# Patient Record
Sex: Female | Born: 1970 | Race: White | Hispanic: No | Marital: Single | Smoking: Never smoker
Health system: Southern US, Community
[De-identification: ages and names within clinical notes are randomized; demographics above are authoritative.]

## PROBLEM LIST (undated history)

## (undated) DIAGNOSIS — I10 Essential (primary) hypertension: Secondary | ICD-10-CM

## (undated) DIAGNOSIS — M549 Dorsalgia, unspecified: Secondary | ICD-10-CM

## (undated) DIAGNOSIS — J45909 Unspecified asthma, uncomplicated: Secondary | ICD-10-CM

## (undated) DIAGNOSIS — E039 Hypothyroidism, unspecified: Secondary | ICD-10-CM

## (undated) DIAGNOSIS — G8929 Other chronic pain: Secondary | ICD-10-CM

## (undated) DIAGNOSIS — M25562 Pain in left knee: Secondary | ICD-10-CM

## (undated) HISTORY — DX: Pain in left knee: M25.562

## (undated) HISTORY — PX: KNEE SURGERY: SHX244

---

## 2002-10-19 ENCOUNTER — Encounter: Admission: RE | Admit: 2002-10-19 | Discharge: 2002-10-19 | Payer: Self-pay | Admitting: Family Medicine

## 2002-10-19 ENCOUNTER — Encounter: Payer: Self-pay | Admitting: Family Medicine

## 2004-04-19 ENCOUNTER — Ambulatory Visit: Payer: Self-pay | Admitting: Pain Medicine

## 2004-05-04 ENCOUNTER — Ambulatory Visit: Payer: Self-pay | Admitting: Physician Assistant

## 2004-06-07 ENCOUNTER — Ambulatory Visit: Payer: Self-pay | Admitting: Pain Medicine

## 2004-07-10 ENCOUNTER — Ambulatory Visit: Payer: Self-pay | Admitting: Pain Medicine

## 2004-07-31 ENCOUNTER — Ambulatory Visit: Payer: Self-pay | Admitting: Physician Assistant

## 2004-12-20 ENCOUNTER — Ambulatory Visit: Payer: Self-pay | Admitting: Physician Assistant

## 2005-01-29 ENCOUNTER — Ambulatory Visit: Payer: Self-pay | Admitting: Pain Medicine

## 2005-04-22 ENCOUNTER — Ambulatory Visit: Payer: Self-pay | Admitting: Physician Assistant

## 2005-08-19 ENCOUNTER — Ambulatory Visit: Payer: Self-pay | Admitting: Physician Assistant

## 2010-02-27 ENCOUNTER — Ambulatory Visit: Payer: Self-pay | Admitting: Family Medicine

## 2012-08-24 ENCOUNTER — Emergency Department: Payer: Self-pay | Admitting: Emergency Medicine

## 2014-10-13 ENCOUNTER — Inpatient Hospital Stay
Admit: 2014-10-13 | Disposition: A | Discharge status: Home / Self Care | Payer: Self-pay | Attending: Internal Medicine | Admitting: Internal Medicine

## 2014-10-13 LAB — CBC WITH DIFFERENTIAL/PLATELET
BASOS PCT: 0.7 %
Basophil #: 0.1 10*3/uL (ref 0.0–0.1)
EOS ABS: 0.3 10*3/uL (ref 0.0–0.7)
EOS PCT: 2 %
HCT: 38.5 % (ref 35.0–47.0)
HGB: 12.5 g/dL (ref 12.0–16.0)
LYMPHS PCT: 25.6 %
Lymphocyte #: 3.3 10*3/uL (ref 1.0–3.6)
MCH: 25.5 pg — ABNORMAL LOW (ref 26.0–34.0)
MCHC: 32.5 g/dL (ref 32.0–36.0)
MCV: 79 fL — ABNORMAL LOW (ref 80–100)
MONOS PCT: 8.5 %
Monocyte #: 1.1 x10 3/mm — ABNORMAL HIGH (ref 0.2–0.9)
NEUTROS PCT: 63.2 %
Neutrophil #: 8.1 10*3/uL — ABNORMAL HIGH (ref 1.4–6.5)
Platelet: 305 10*3/uL (ref 150–440)
RBC: 4.89 10*6/uL (ref 3.80–5.20)
RDW: 16.4 % — ABNORMAL HIGH (ref 11.5–14.5)
WBC: 12.8 10*3/uL — ABNORMAL HIGH (ref 3.6–11.0)

## 2014-10-13 LAB — BASIC METABOLIC PANEL
Anion Gap: 7 (ref 7–16)
BUN: 11 mg/dL
CREATININE: 0.82 mg/dL
Calcium, Total: 8.9 mg/dL
Chloride: 98 mmol/L — ABNORMAL LOW
Co2: 33 mmol/L — ABNORMAL HIGH
EGFR (African American): >60
GLUCOSE: 77 mg/dL
Potassium: 3.2 mmol/L — ABNORMAL LOW
Sodium: 138 mmol/L

## 2014-10-14 LAB — CBC WITH DIFFERENTIAL/PLATELET
BASOS PCT: 0.5 %
Basophil #: 0.1 10*3/uL (ref 0.0–0.1)
EOS ABS: 0.3 10*3/uL (ref 0.0–0.7)
Eosinophil %: 2.3 %
HCT: 33.7 % — ABNORMAL LOW (ref 35.0–47.0)
HGB: 10.9 g/dL — AB (ref 12.0–16.0)
Lymphocyte #: 4.3 10*3/uL — ABNORMAL HIGH (ref 1.0–3.6)
Lymphocyte %: 35.1 %
MCH: 25.4 pg — AB (ref 26.0–34.0)
MCHC: 32.2 g/dL (ref 32.0–36.0)
MCV: 79 fL — ABNORMAL LOW (ref 80–100)
MONO ABS: 1.2 x10 3/mm — AB (ref 0.2–0.9)
Monocyte %: 9.4 %
NEUTROS ABS: 6.5 10*3/uL (ref 1.4–6.5)
NEUTROS PCT: 52.7 %
Platelet: 273 10*3/uL (ref 150–440)
RBC: 4.28 10*6/uL (ref 3.80–5.20)
RDW: 16.5 % — ABNORMAL HIGH (ref 11.5–14.5)
WBC: 12.3 10*3/uL — ABNORMAL HIGH (ref 3.6–11.0)

## 2014-10-14 LAB — BASIC METABOLIC PANEL
Anion Gap: 5 — ABNORMAL LOW (ref 7–16)
BUN: 10 mg/dL
CALCIUM: 8.2 mg/dL — AB
CREATININE: 0.73 mg/dL
Chloride: 103 mmol/L
Co2: 29 mmol/L
Glucose: 102 mg/dL — ABNORMAL HIGH
POTASSIUM: 3.3 mmol/L — AB
SODIUM: 137 mmol/L

## 2014-10-15 LAB — VANCOMYCIN, TROUGH: Vancomycin, Trough: 20 ug/mL

## 2014-10-16 LAB — VANCOMYCIN, TROUGH: VANCOMYCIN, TROUGH: 22 ug/mL — AB

## 2014-10-18 LAB — CULTURE, BLOOD (SINGLE)

## 2014-11-06 NOTE — Consult Note (Signed)
PATIENT NAME:  Amy RuaREMETZ, Tala M MR#:  [id number] DATE OF BIRTH:  May 02, 1971  DATE OF CONSULTATION:  10/15/2014  REFERRING PHYSICIAN:   CONSULTING PHYSICIAN:  Adah Salvageichard E. Excell Seltzerooper, MD  CHIEF COMPLAINT: Left arm cellulitis.   HISTORY OF PRESENT ILLNESS:   This is a patient with several days of left arm pain and a diagnosis of cellulitis which is improved considerably while in the hospital the last few days on IV antibiotics. I was asked to see the patient for possible abscess.   She thinks that this may have been a spider bite.  She had had some fevers and chills, but did not take her temperature.   PAST MEDICAL HISTORY: Hypothyroidism and morbid obesity.   PAST SURGICAL HISTORY: None.   SOCIAL HISTORY: The patient does not smoke or drink, and does not use IV drugs.   REVIEW OF SYSTEMS: A complete system review was performed and negative with the exception of that mentioned in the HPI.   FAMILY HISTORY:   Diabetes.   MEDICATIONS: Multiple, see reconciliation.   ALLERGIES: None.   PHYSICAL EXAMINATION: GENERAL: Morbidly obese female patient with a BMI of 39.  VITAL SIGNS: Temperature is 97.6, pulse is 75, respirations 20, blood pressure 103/67.  GENERAL: The patient appears comfortable.  HEENT: Shows no scleral icterus.  NECK: No palpable neck nodes.  CHEST: Clear to auscultation.   CARDIAC: Regular rate and rhythm.  ABDOMEN: Soft, nontender but obese.  EXTREMITIES: Show no edema but on the left arm, there is a punctate area of eschar with no expressible pus. No surrounding erythema. There are ink margins drawn where prior erythema was present, none presently.  The patient has full range of motion at the wrist and fingers with no tenderness with flexion or extension of the extensors.   ASSESSMENT AND PLAN: This is a patient with resolving cellulitis. No sign of abscess. Recommend observation at this time and re-examination as well as elevation of her arm and continuing IV antibiotics.      ____________________________ Adah Salvageichard E. Excell Seltzerooper, MD rec:tr D: 10/15/2014 16:26:10 ET T: 10/15/2014 17:16:38 ET JOB#: [id number]  cc: Adah Salvageichard E. Excell Seltzerooper, MD, <Dictator> Lattie HawICHARD E Soleia Badolato MD ELECTRONICALLY SIGNED 10/15/2014 19:06

## 2014-11-06 NOTE — H&P (Signed)
PATIENT NAME:  Amy Brown, [name] MR#:  [id number] DATE OF BIRTH:  May 17, 1971  DATE OF ADMISSION:  10/13/2014  REFERRING PHYSICIAN:  Eartha Inchory R. York CeriseForbach, MD  PRIMARY CARE PHYSICIAN:  Amy SaverL. Katherine Bliss, MD   CHIEF COMPLAINT:  Left arm pain.   HISTORY OF PRESENT ILLNESS:  A 44 year old Caucasian female with history of hypothyroidism, unspecified, as well as lumbago, presenting with left arm pain. She describes a 1-day duration of left arm pain located in the forearm, initially thought it was a possible "spider bite," describes pain as throbbing in quality, 7 out of 10 in intensity, worsened with activity, no real relieving factors with surrounding erythema and no discharge. She does complain of subjective fevers and chills, thus presented to the hospital for further workup and evaluation.   REVIEW OF SYSTEMS: CONSTITUTIONAL:  Positive for subjective fevers and chills. Denies fatigue or weakness.  EYES:  Denies blurred vision, double vision, or eye pain.  EARS, NOSE, AND THROAT:  Denies tinnitus, ear pain, or hearing loss.  RESPIRATORY:  Denies cough, wheeze, or shortness of breath.  CARDIOVASCULAR:  Denies chest pain, palpitations, or edema.  GASTROINTESTINAL:  Denies nausea, vomiting, diarrhea, or abdominal pain.  GENITOURINARY:  Denies dysuria or hematuria.  ENDOCRINE:  Denies nocturia or thyroid problems.  HEMATOLOGIC AND LYMPHATIC:  Denies easy bruising or bleeding.  SKIN:  Positive for erythematous lesion on the left forearm as described above.  MUSCULOSKELETAL:  Positive for pain in the left forearm as described above. Otherwise, denies pain in the neck, back, shoulders, knees, or hips or further arthritic symptoms.  NEUROLOGIC:  Denies paralysis or paresthesias.  PSYCHIATRIC:  Denies anxiety or depressive symptoms.  Otherwise, full review of systems performed by me is negative.   PAST MEDICAL HISTORY:  Includes chronic lumbago and hypothyroidism, unspecified.   SOCIAL HISTORY:  Denies any  alcohol, tobacco, or drug usage.   FAMILY HISTORY:  Positive for diabetes as well as coronary artery disease.   HOME MEDICATIONS:  Include meloxicam 15 mg p.o. daily, methadone 10 mg p.o. 4 times daily, Lasix 40 mg 2 tablets daily, Flexeril 10 mg p.o. 3 times daily as needed.   PHYSICAL EXAMINATION: VITAL SIGNS:  Temperature 98.1, heart rate 92, respirations 20, blood pressure 120/84, saturating 97% on room air. Weight 114.4 kg, BMI 38.4.  GENERAL:  Obese, Caucasian female currently in no acute distress.  HEAD:  Normocephalic, atraumatic.  EYES:  Pupils are equal, round, and reactive to light. Extraocular muscles are intact. No scleral icterus.  MOUTH:  Moist mucosal membranes. Dentition is intact. No abscess noted.  EARS, NOSE, AND THROAT:  Clear without exudates. No external lesions.  NECK:  Supple. No thyromegaly. No nodules. No JVD.  PULMONARY:  Clear to auscultation bilaterally without wheezes, rales, or rhonchi. No use of accessory muscles. Good respiratory effort.  CHEST:  Nontender to palpation.  CARDIOVASCULAR:  S1 and S2, regular rate and rhythm. No murmurs, rubs, or gallops. No edema. Pedal pulses are 2+ bilaterally.  GASTROINTESTINAL:  Soft, nontender, nondistended. No masses. Positive bowel sounds. No hepatosplenomegaly.  MUSCULOSKELETAL:  There is gross left forearm swelling present; otherwise, no edema or clubbing. Range of motion is full in all extremities.  NEUROLOGIC:  Cranial nerves II through XII are intact. No gross focal neurological deficits. Sensation is intact. Reflexes are intact.  SKIN:  There is an erythematous region of the left forearm with a punctate lesion at the center, which has been marked with a marker. Warm to touch. Otherwise, no  further lesions, rashes, or cyanosis. Skin is warm and dry. Turgor is intact.  PSYCHIATRIC:  Mood and affect are within normal limits. The patient is awake, alert, and oriented x 3. Insight and judgment are intact.   LABORATORY  DATA:  Sodium is 138, potassium 3.2, chloride 98, bicarbonate 33, BUN 11, creatinine 0.82, glucose 77. WBC is 12.8, hemoglobin 12.5, and platelets 309,000.   ASSESSMENT AND PLAN:  A 44 year old Caucasian female with a history of hypothyroidism, unspecified, as well as lumbago, chronic, presenting with left arm pain.   1.  Left arm cellulitis. We will follow blood cultures that were drawn in the Emergency Department and cover with vancomycin for antibiotic coverage. Follow culture data. Adjust antibiotics accordingly. Provide p.r.n. pain medications.  2.  Lumbago. Continue with home medications including Flexeril, methadone, and meloxicam.  3.  Hypokalemia. Replace to goal of 4 to 5.  4.  Venous thromboembolism prophylaxis with heparin subcutaneously.   CODE STATUS:  The patient is a full code.   TIME SPENT:  35 minutes.    ____________________________ Cletis Athens. Hower, MD dkh:nb D: 10/13/2014 22:51:43 ET T: 10/13/2014 23:32:03 ET JOB#: [id number]  cc: Cletis Athens. Hower, MD, <Dictator> DAVID Synetta Shadow MD ELECTRONICALLY SIGNED 10/14/2014 3:56

## 2014-11-06 NOTE — Discharge Summary (Signed)
PATIENT NAME:  Amy RuaREMETZ, Amy Brown MR#:  [id number] DATE OF BIRTH:  16-Jan-1971  DATE OF ADMISSION:  10/13/2014 DATE OF DISCHARGE:  10/17/2014  DISCHARGE DIAGNOSES: 1. Left forearm cellulitis.  2. Chronic low back pain.  3. Sepsis.   DISCHARGE MEDICATIONS: 1. Flexeril 10 mg oral 3 times a day.  2. Lasix 40 mg 2 tablets daily.  3. Meloxicam 15 mg oral once a day.  4. Methadone 10 mg oral 4 times a day.  5. Ketorolac 10 mg oral every 6 hours as needed for headache.  6. Acetaminophen and oxycodone 325/5 one tablet every 6 hours as needed.  7. Bactrim DS 800/160 mg oral 2 times a day.   DISCHARGE INSTRUCTIONS:  Regular food. Activity as tolerated. Follow up with Dr. Quillian QuinceBliss in 1-2 weeks.   CONSULTS: Dr. Excell Seltzerooper with surgery.   IMAGING STUDIES: Include an ultrasound of the left forearm which showed swelling and cellulitis, but no frank abscess.   ADMITTING HISTORY AND PHYSICAL AND HOSPITAL COURSE: Please see detailed H and P dictated previously by Dr. Clint GuyHower. In brief, a 44 year old Caucasian female patient who recently had a spider bite. Presented with worsening redness, swelling and pain of the left forearm. She was started on vancomycin to which she responded well. Surgery was consulted for possible I and D. No I and D was advised.  Ultrasound did not show any frank abscess. The patient has improved well.  Redness has almost resolved.  Afebrile, normal white count, and is being discharged home in a stable condition.   Prior to discharge, the patient's lungs sound clear. S1, S2 heard.   TIME SPENT ON DAY OF DISCHARGE IN DISCHARGE ACTIVITY: Was 40 minutes.    ____________________________ Amy HighmanStriker R. Orlie DakinSudanic, MD srs:tr D: 10/18/2014 15:52:27 ET T: 10/18/2014 21:13:22 ET JOB#: [id number]  cc: Wardell HeathSrikar R. Kittie Krizan, MD, <Dictator> Orie FishermanSRIKAR R Kynzlie Hilleary MD ELECTRONICALLY SIGNED 10/31/2014 11:17

## 2014-11-06 NOTE — Consult Note (Signed)
Brief Consult Note: Diagnosis: left forearm cellulitis.   Patient was seen by consultant.   Consult note dictated.   Recommend further assessment or treatment.   Orders entered.   Comments: improving cellulitis, no obv abscess elevate left arm cont IV abx reexamine.  Electronic Signatures: Lattie Hawooper, Amadea Keagy E (MD)  (Signed 09-Apr-16 16:20)  Authored: Brief Consult Note   Last Updated: 09-Apr-16 16:20 by Lattie Hawooper, Kimala Horne E (MD)

## 2015-02-09 ENCOUNTER — Encounter: Payer: Self-pay | Admitting: Urgent Care

## 2015-02-09 ENCOUNTER — Emergency Department: Payer: Medicaid Other

## 2015-02-09 ENCOUNTER — Emergency Department
Admission: EM | Admit: 2015-02-09 | Discharge: 2015-02-09 | Disposition: A | Discharge status: Home / Self Care | Payer: Medicaid Other | Attending: Emergency Medicine | Admitting: Emergency Medicine

## 2015-02-09 DIAGNOSIS — Y998 Other external cause status: Secondary | ICD-10-CM | POA: Diagnosis not present

## 2015-02-09 DIAGNOSIS — S0990XA Unspecified injury of head, initial encounter: Secondary | ICD-10-CM | POA: Insufficient documentation

## 2015-02-09 DIAGNOSIS — W010XXA Fall on same level from slipping, tripping and stumbling without subsequent striking against object, initial encounter: Secondary | ICD-10-CM | POA: Diagnosis not present

## 2015-02-09 DIAGNOSIS — Z79899 Other long term (current) drug therapy: Secondary | ICD-10-CM | POA: Diagnosis not present

## 2015-02-09 DIAGNOSIS — Y9389 Activity, other specified: Secondary | ICD-10-CM | POA: Diagnosis not present

## 2015-02-09 DIAGNOSIS — R509 Fever, unspecified: Secondary | ICD-10-CM | POA: Insufficient documentation

## 2015-02-09 DIAGNOSIS — Z791 Long term (current) use of non-steroidal anti-inflammatories (NSAID): Secondary | ICD-10-CM | POA: Diagnosis not present

## 2015-02-09 DIAGNOSIS — Z79891 Long term (current) use of opiate analgesic: Secondary | ICD-10-CM | POA: Diagnosis not present

## 2015-02-09 DIAGNOSIS — Y9289 Other specified places as the place of occurrence of the external cause: Secondary | ICD-10-CM | POA: Diagnosis not present

## 2015-02-09 DIAGNOSIS — Z3202 Encounter for pregnancy test, result negative: Secondary | ICD-10-CM | POA: Insufficient documentation

## 2015-02-09 DIAGNOSIS — W19XXXA Unspecified fall, initial encounter: Secondary | ICD-10-CM

## 2015-02-09 DIAGNOSIS — R51 Headache: Secondary | ICD-10-CM

## 2015-02-09 DIAGNOSIS — R519 Headache, unspecified: Secondary | ICD-10-CM

## 2015-02-09 HISTORY — DX: Other chronic pain: G89.29

## 2015-02-09 HISTORY — DX: Dorsalgia, unspecified: M54.9

## 2015-02-09 LAB — URINALYSIS COMPLETE WITH MICROSCOPIC (ARMC ONLY)
BACTERIA UA: NONE SEEN
BILIRUBIN URINE: NEGATIVE
Glucose, UA: NEGATIVE mg/dL
HGB URINE DIPSTICK: NEGATIVE
Ketones, ur: NEGATIVE mg/dL
Leukocytes, UA: NEGATIVE
Nitrite: NEGATIVE
PH: 6 (ref 5.0–8.0)
Protein, ur: NEGATIVE mg/dL
Specific Gravity, Urine: 1.021 (ref 1.005–1.030)

## 2015-02-09 LAB — POCT PREGNANCY, URINE: PREG TEST UR: NEGATIVE

## 2015-02-09 MED ORDER — HYDROMORPHONE HCL 1 MG/ML IJ SOLN
1.0000 mg | Freq: Once | INTRAMUSCULAR | Status: AC
  Administered 2015-02-09: 1 mg via INTRAMUSCULAR
  Filled 2015-02-09: qty 1

## 2015-02-09 NOTE — ED Notes (Signed)
Patient presents with reports of a headache that occurred yesterday at 0100. Patient reports that she was walking her dogs and they caused her to fall and hit head on steps. Denies LOC. Ever since she fell c/o headache no change even with torodol and methadone.

## 2015-02-09 NOTE — Discharge Instructions (Signed)
You have been seen in the emergency department for a headache following a fall 24 hours ago. Your workup including CT scan shows normal results. You do have a low grade fever today. Please take Tylenol/Motrin at home as needed for headache or fever. As we discussed please call your primary care doctor (Dr. Bliss) today to arrange a follow-up appQuillian Quincentment hopefully for today. Return to the emergency department for any worsening headache, worsening fever, confusion, lethargy, or any other symptom personally concerning to your self.    General Headache Without Cause A headache is pain or discomfort felt around the head or neck area. The specific cause of a headache may not be found. There are many causes and types of headaches. A few common ones are:  Tension headaches.  Migraine headaches.  Cluster headaches.  Chronic daily headaches. HOME CARE INSTRUCTIONS   Keep all follow-up appointments with your caregiver or any specialist referral.  Only take over-the-counter or prescription medicines for pain or discomfort as directed by your caregiver.  Lie down in a dark, quiet room when you have a headache.  Keep a headache journal to find out what may trigger your migraine headaches. For example, write down:  What you eat and drink.  How much sleep you get.  Any change to your diet or medicines.  Try massage or other relaxation techniques.  Put ice packs or heat on the head and neck. Use these 3 to 4 times per day for 15 to 20 minutes each time, or as needed.  Limit stress.  Sit up straight, and do not tense your muscles.  Quit smoking if you smoke.  Limit alcohol use.  Decrease the amount of caffeine you drink, or stop drinking caffeine.  Eat and sleep on a regular schedule.  Get 7 to 9 hours of sleep, or as recommended by your caregiver.  Keep lights dim if bright lights bother you and make your headaches worse. SEEK MEDICAL CARE IF:   You have problems with the medicines  you were prescribed.  Your medicines are not working.  You have a change from the usual headache.  You have nausea or vomiting. SEEK IMMEDIATE MEDICAL CARE IF:   Your headache becomes severe.  You have a fever.  You have a stiff neck.  You have loss of vision.  You have muscular weakness or loss of muscle control.  You start losing your balance or have trouble walking.  You feel faint or pass out.  You have severe symptoms that are different from your first symptoms. MAKE SURE YOU:   Understand these instructions.  Will watch your condition.  Will get help right away if you are not doing well or get worse. Document Released: 06/24/2005 Document Revised: 09/16/2011 Document Reviewed: 07/10/2011 [facility] Patient Information 2015 [place], [place]. This information is not intended to replace advice given to you by your health care provider. Make sure you discuss any questions you have with your health care provider.

## 2015-02-09 NOTE — ED Notes (Signed)
Patient presents with reports of a headache that occurred YESTERDAY at 0100. Patient reports that she was walking her dogs and they caused her to fall and hit head on steps. Denies LOC.

## 2015-02-09 NOTE — ED Provider Notes (Signed)
[facility] Emergency Department Provider Note  Time seen: 4:00 AM  I have reviewed the triage vital signs and the nursing notes.   HISTORY  Chief Complaint Fall and Headache    HPI JAIDA BASURTO is a 44 y.o. female with a past medical history of chronic back pain on methadone who presents the emergency department after a fall approximately 28 hours ago. Patient states it was a mechanical fall, she tripped falling backwards hitting the back of her head. Denies loss of consciousness. Denies focal weakness or numbness. She states since the fall she has had a worsening headache so she came to the emergency department for evaluation. Patient denies any confusion, slurred speech, weakness or numbness. Here with family who also deny the same. Patient does have a mild fever of 100.6, states she was unaware of the fever, denies cough, or dysuria. She does state mild nasal congestion starting today. Describes her headache is moderate to severe, no modifying factors.     Past Medical History  Diagnosis Date  . Chronic back pain     There are no active problems to display for this patient.   History reviewed. No pertinent past surgical history.  Current Outpatient Rx  Name  Route  Sig  Dispense  Refill  . cyclobenzaprine (FLEXERIL) 10 MG tablet   Oral   Take 10 mg by mouth 3 (three) times daily as needed for muscle spasms.         . furosemide (LASIX) 40 MG tablet   Oral   Take 40 mg by mouth daily.         Marland Kitchen ketorolac (TORADOL) 10 MG tablet   Oral   Take 10 mg by mouth every 6 (six) hours as needed.         . meloxicam (MOBIC) 15 MG tablet   Oral   Take 15 mg by mouth daily.         . methadone (DOLOPHINE) 10 MG tablet   Oral   Take 10 mg by mouth every 6 (six) hours.           Allergies Review of patient's allergies indicates no known allergies.  No family history on file.  Social History History  Substance Use Topics  . Smoking  status: Never Smoker   . Smokeless tobacco: Not on file  . Alcohol Use: No    Review of Systems Constitutional: Positive for fever in the emergency department, patient was previously unaware. Eyes: Negative for visual changes. ENT: Mild nasal congestion, right ear pain Cardiovascular: Negative for chest pain. Respiratory: Negative for shortness of breath. Gastrointestinal: Negative for abdominal pain Genitourinary: Negative for dysuria Neurological: Positive for headache. 10-point ROS otherwise negative.  ____________________________________________   PHYSICAL EXAM:  VITAL SIGNS: ED Triage Vitals  Enc Vitals Group     BP 02/09/15 0344 141/80 mmHg     Pulse Rate 02/09/15 0344 122     Resp 02/09/15 0344 24     Temp 02/09/15 0344 100.6 F (38.1 C)     Temp Source 02/09/15 0344 Oral     SpO2 02/09/15 0344 100 %     Weight 02/09/15 0344 245 lb (111.131 kg)     Height 02/09/15 0344 5\' 7"  (1.702 m)     Head Cir --      Peak Flow --      Pain Score 02/09/15 0345 8     Pain Loc --      Pain Edu? --  Excl. in GC? --     Constitutional: Alert and oriented. Well appearing and in no distress. Eyes: Normal exam, 2 mm PERRL bilaterally ENT   Head: Normocephalic and atraumatic.   Nose: Mild rhinorrhea/congestion on exam. Normal tympanic membranes bilaterally   Mouth/Throat: Mucous membranes are moist. Cardiovascular: Normal rate, regular rhythm. No murmur Respiratory: Normal respiratory effort without tachypnea nor retractions. Breath sounds are clear  Gastrointestinal: Soft and nontender. No distention.  Musculoskeletal: Nontender with normal range of motion in all extremities. Neurologic:  Normal speech and language. No gross focal neurologic deficits . 5/5 strength in all extremities. Equal grip strengths, no pronator drift. Skin:  Skin is warm, dry and intact.  Psychiatric: Mood and affect are normal. Speech and behavior are  normal ____________________________________________    INITIAL IMPRESSION / ASSESSMENT AND PLAN / ED COURSE  Pertinent labs & imaging results that were available during my care of the patient were reviewed by me and considered in my medical decision making (see chart for details).  Patient with a fall approximately 28 hours ago presents the emergency department for worsening headache. Patient's vitals do show a mild temperature, we will recheck this. Slightly tachycardic likely due to discomfort. We will treat the patient's pain, obtain a CT head to further evaluate her headache, and a urinalysis.  Urinalysis within normal limits. CT head and neck within normal limits. Patient does have a low-grade fever, states congestion today. Patient's main complaint is headache and neck pain which both started immediately after a fall approximately 30 hours ago now. CT does not show any intracranial abnormalities. Patient does take methadone for chronic pain, states she is due to take it this morning. She sees Dr. Quillian Quince as her primary care doctor, I discussed with the patient the need to call him today to obtain a follow-up appointment hopefully for today for reevaluation. Patient is agreeable to this plan. It is unclear what the etiology of the patient's low-grade fevers at this time, however I do not suspect meningitis given the acute onset of her headache following her fall, which she states was mechanical. Neck is nontender on exam, able to move in all directions with no apparent nuchal rigidity. Patient is mildly tachycardic, pulse rate around 110 bpm while sleeping. I discussed with patient plenty of fluids, tachycardia is likely due to to low-grade fever.  ____________________________________________   FINAL CLINICAL IMPRESSION(S) / ED DIAGNOSES headache Thresa Ross, MD 02/09/15 [phone]

## 2015-03-10 ENCOUNTER — Emergency Department
Admission: EM | Admit: 2015-03-10 | Discharge: 2015-03-10 | Disposition: A | Discharge status: Home / Self Care | Payer: Medicaid Other | Attending: Emergency Medicine | Admitting: Emergency Medicine

## 2015-03-10 DIAGNOSIS — Z79891 Long term (current) use of opiate analgesic: Secondary | ICD-10-CM | POA: Diagnosis not present

## 2015-03-10 DIAGNOSIS — G8929 Other chronic pain: Secondary | ICD-10-CM | POA: Diagnosis not present

## 2015-03-10 DIAGNOSIS — M549 Dorsalgia, unspecified: Secondary | ICD-10-CM | POA: Insufficient documentation

## 2015-03-10 DIAGNOSIS — Z79899 Other long term (current) drug therapy: Secondary | ICD-10-CM | POA: Insufficient documentation

## 2015-03-10 DIAGNOSIS — L03311 Cellulitis of abdominal wall: Secondary | ICD-10-CM | POA: Insufficient documentation

## 2015-03-10 DIAGNOSIS — R109 Unspecified abdominal pain: Secondary | ICD-10-CM | POA: Diagnosis present

## 2015-03-10 DIAGNOSIS — Z791 Long term (current) use of non-steroidal anti-inflammatories (NSAID): Secondary | ICD-10-CM | POA: Diagnosis not present

## 2015-03-10 MED ORDER — HYDROCODONE-ACETAMINOPHEN 5-325 MG PO TABS
1.0000 | ORAL_TABLET | Freq: Once | ORAL | Status: AC
  Administered 2015-03-10: 1 via ORAL
  Filled 2015-03-10: qty 1

## 2015-03-10 MED ORDER — CLINDAMYCIN HCL 150 MG PO CAPS: 300.0000 mg | ORAL_CAPSULE | Freq: Three times a day (TID) | ORAL | Status: DC

## 2015-03-10 MED ORDER — HYDROCODONE-ACETAMINOPHEN 5-325 MG PO TABS: 1.0000 | ORAL_TABLET | ORAL | Status: DC | PRN

## 2015-03-10 MED ORDER — CLINDAMYCIN PHOSPHATE 900 MG/6ML IJ SOLN
600.0000 mg | Freq: Once | INTRAMUSCULAR | Status: AC
  Administered 2015-03-10: 600 mg via INTRAMUSCULAR
  Filled 2015-03-10: qty 6

## 2015-03-10 NOTE — ED Notes (Signed)
Patient reports noticed area to abdomen 2 days ago and thinks she was bitten by something.

## 2015-03-10 NOTE — ED Provider Notes (Signed)
[facility] Emergency Department Provider Note  ____________________________________________  Time seen: Approximately 9:59 PM  I have reviewed the triage vital signs and the nursing notes.   HISTORY  Chief Complaint Insect Bite   HPI Amy Brown is a 44 y.o. female patient is here with complaint of possible insect bite to her abdomen 2 days ago. She states his continued to get red and swollen. She is unaware of any fever and denies nausea or vomiting. She denies any previous cellulitis or abscesses. Currently her pain score is 7 out of 10. She denies any history of diabetes and states her only medical problem is chronic back pain. Currently she is on methadone along with several other medications for this.   Past Medical History  Diagnosis Date  . Chronic back pain     There are no active problems to display for this patient.   No past surgical history on file.  Current Outpatient Rx  Name  Route  Sig  Dispense  Refill  . clindamycin (CLEOCIN) 150 MG capsule   Oral   Take 2 capsules (300 mg total) by mouth 3 (three) times daily.   60 capsule   0   . cyclobenzaprine (FLEXERIL) 10 MG tablet   Oral   Take 10 mg by mouth 3 (three) times daily as needed for muscle spasms.         . furosemide (LASIX) 40 MG tablet   Oral   Take 40 mg by mouth daily.         Marland Kitchen HYDROcodone-acetaminophen (NORCO/VICODIN) 5-325 MG per tablet   Oral   Take 1 tablet by mouth every 4 (four) hours as needed for moderate pain.   15 tablet   0   . ketorolac (TORADOL) 10 MG tablet   Oral   Take 10 mg by mouth every 6 (six) hours as needed.         . meloxicam (MOBIC) 15 MG tablet   Oral   Take 15 mg by mouth daily.         . methadone (DOLOPHINE) 10 MG tablet   Oral   Take 10 mg by mouth every 6 (six) hours.           Allergies Review of patient's allergies indicates no known allergies.  No family history on file.  Social History Social History   Substance Use Topics  . Smoking status: Never Smoker   . Smokeless tobacco: Not on file  . Alcohol Use: No    Review of Systems Constitutional: No fever/chills Cardiovascular: Denies chest pain. Respiratory: Denies shortness of breath. Gastrointestinal:   No nausea, no vomiting.  No diarrhea.  No constipation. Genitourinary: Negative for dysuria. Musculoskeletal: Positive for chronic back pain Skin: Positive for redness on her abdomen. Neurological: Negative for headaches, focal weakness or numbness.  10-point ROS otherwise negative.  ____________________________________________   PHYSICAL EXAM:  VITAL SIGNS: ED Triage Vitals  Enc Vitals Group     BP 03/10/15 2106 135/83 mmHg     Pulse Rate 03/10/15 2106 99     Resp 03/10/15 2106 20     Temp 03/10/15 2106 98.7 F (37.1 C)     Temp src --      SpO2 03/10/15 2106 99 %     Weight 03/10/15 2106 242 lb (109.77 kg)     Height 03/10/15 2106 5\' 9"  (1.753 m)     Head Cir --      Peak Flow --  Pain Score 03/10/15 2107 7     Pain Loc --      Pain Edu? --      Excl. in GC? --     Constitutional: Alert and oriented. Well appearing and in no acute distress. Eyes: Conjunctivae are normal. PERRL. EOMI. Head: Atraumatic. Nose: No congestion/rhinnorhea. Neck: No stridor.   Cardiovascular: Normal rate, regular rhythm. Grossly normal heart sounds.  Good peripheral circulation. Respiratory: Normal respiratory effort.  No retractions. Lungs CTAB. Gastrointestinal: Soft.  No distention. No abdominal bruits.  Musculoskeletal: No lower extremity tenderness nor edema.  No joint effusions. Neurologic:  Normal speech and language. No gross focal neurologic deficits are appreciated. No gait instability. Skin: Right sided abdomen has approximately a 12 cm erythematous area that is tender to touch. Area is warm. There are multiple areas with in the cellulitis that appears to be possible puncture versus self-inflicted. Patient denies  actually seeing any spiders or any insects for that to be explained. She denies any physical trauma. Psychiatric: Mood and affect are normal. Speech and behavior are normal.  ____________________________________________   LABS (all labs ordered are listed, but only abnormal results are displayed)  Labs Reviewed - No data to display  PROCEDURES  Procedure(s) performed: None  Critical Care performed: No  ____________________________________________   INITIAL IMPRESSION / ASSESSMENT AND PLAN / ED COURSE  Pertinent labs & imaging results that were available during my care of the patient were reviewed by me and considered in my medical decision making (see chart for details).  She was given clindamycin 600 mg IM while in the emergency room along with Norco as for pain. Patient was given a prescription for Norco along with prescription for clindamycin for infection. She is return to the emergency room this weekend if any severe worsening of her symptoms including fever or chills. ____________________________________________   FINAL CLINICAL IMPRESSION(S) / ED DIAGNOSES  Final diagnoses:  Cellulitis of abdominal wall      Tommi Rumps, PA-C 03/10/15 2228  Loleta Rose, MD 03/10/15 2325

## 2015-03-10 NOTE — Discharge Instructions (Signed)

## 2015-03-10 NOTE — ED Notes (Signed)
Patient with no complaints at this time. Respirations even and unlabored. Skin warm/dry. Discharge instructions reviewed with patient at this time. Patient given opportunity to voice concerns/ask questions. Patient discharged at this time and left Emergency Department with steady gait.   

## 2015-03-12 ENCOUNTER — Encounter: Payer: Self-pay | Admitting: Emergency Medicine

## 2015-03-12 DIAGNOSIS — L03311 Cellulitis of abdominal wall: Principal | ICD-10-CM | POA: Diagnosis present

## 2015-03-12 DIAGNOSIS — Y939 Activity, unspecified: Secondary | ICD-10-CM

## 2015-03-12 DIAGNOSIS — Z833 Family history of diabetes mellitus: Secondary | ICD-10-CM

## 2015-03-12 DIAGNOSIS — Y929 Unspecified place or not applicable: Secondary | ICD-10-CM

## 2015-03-12 DIAGNOSIS — Y998 Other external cause status: Secondary | ICD-10-CM

## 2015-03-12 DIAGNOSIS — Z8249 Family history of ischemic heart disease and other diseases of the circulatory system: Secondary | ICD-10-CM

## 2015-03-12 DIAGNOSIS — T63301A Toxic effect of unspecified spider venom, accidental (unintentional), initial encounter: Secondary | ICD-10-CM | POA: Diagnosis present

## 2015-03-12 DIAGNOSIS — M549 Dorsalgia, unspecified: Secondary | ICD-10-CM | POA: Diagnosis present

## 2015-03-12 DIAGNOSIS — E039 Hypothyroidism, unspecified: Secondary | ICD-10-CM | POA: Diagnosis present

## 2015-03-12 DIAGNOSIS — E876 Hypokalemia: Secondary | ICD-10-CM | POA: Diagnosis present

## 2015-03-12 DIAGNOSIS — R609 Edema, unspecified: Secondary | ICD-10-CM | POA: Diagnosis not present

## 2015-03-12 DIAGNOSIS — B9789 Other viral agents as the cause of diseases classified elsewhere: Secondary | ICD-10-CM | POA: Diagnosis present

## 2015-03-12 NOTE — ED Notes (Signed)
Pt reports abscess to right side of abdomen, was seen Friday night and placed on antibiotics. Pt reports area getting worse, reports "black stuff came shooting out of it".

## 2015-03-13 ENCOUNTER — Encounter: Payer: Self-pay | Admitting: Internal Medicine

## 2015-03-13 ENCOUNTER — Inpatient Hospital Stay
Admission: EM | Admit: 2015-03-13 | Discharge: 2015-03-14 | DRG: 603 | Disposition: A | Discharge status: Home / Self Care | Payer: Medicaid Other | Attending: Internal Medicine | Admitting: Internal Medicine

## 2015-03-13 DIAGNOSIS — R609 Edema, unspecified: Secondary | ICD-10-CM | POA: Diagnosis not present

## 2015-03-13 DIAGNOSIS — M549 Dorsalgia, unspecified: Secondary | ICD-10-CM | POA: Diagnosis present

## 2015-03-13 DIAGNOSIS — E039 Hypothyroidism, unspecified: Secondary | ICD-10-CM | POA: Diagnosis present

## 2015-03-13 DIAGNOSIS — Y939 Activity, unspecified: Secondary | ICD-10-CM | POA: Diagnosis not present

## 2015-03-13 DIAGNOSIS — E876 Hypokalemia: Secondary | ICD-10-CM | POA: Diagnosis present

## 2015-03-13 DIAGNOSIS — L03311 Cellulitis of abdominal wall: Secondary | ICD-10-CM | POA: Diagnosis present

## 2015-03-13 DIAGNOSIS — Z8249 Family history of ischemic heart disease and other diseases of the circulatory system: Secondary | ICD-10-CM | POA: Diagnosis not present

## 2015-03-13 DIAGNOSIS — Y998 Other external cause status: Secondary | ICD-10-CM | POA: Diagnosis not present

## 2015-03-13 DIAGNOSIS — R600 Localized edema: Secondary | ICD-10-CM | POA: Diagnosis present

## 2015-03-13 DIAGNOSIS — G8929 Other chronic pain: Secondary | ICD-10-CM | POA: Diagnosis present

## 2015-03-13 DIAGNOSIS — Y929 Unspecified place or not applicable: Secondary | ICD-10-CM | POA: Diagnosis not present

## 2015-03-13 DIAGNOSIS — T63301A Toxic effect of unspecified spider venom, accidental (unintentional), initial encounter: Secondary | ICD-10-CM | POA: Diagnosis present

## 2015-03-13 DIAGNOSIS — B9789 Other viral agents as the cause of diseases classified elsewhere: Secondary | ICD-10-CM | POA: Diagnosis present

## 2015-03-13 DIAGNOSIS — Z833 Family history of diabetes mellitus: Secondary | ICD-10-CM | POA: Diagnosis not present

## 2015-03-13 HISTORY — DX: Hypothyroidism, unspecified: E03.9

## 2015-03-13 LAB — CBC
HEMATOCRIT: 36.1 % (ref 35.0–47.0)
Hemoglobin: 12.1 g/dL (ref 12.0–16.0)
MCH: 25.3 pg — ABNORMAL LOW (ref 26.0–34.0)
MCHC: 33.4 g/dL (ref 32.0–36.0)
MCV: 75.7 fL — ABNORMAL LOW (ref 80.0–100.0)
Platelets: 380 10*3/uL (ref 150–440)
RBC: 4.77 MIL/uL (ref 3.80–5.20)
RDW: 16.5 % — ABNORMAL HIGH (ref 11.5–14.5)
WBC: 10.5 10*3/uL (ref 3.6–11.0)

## 2015-03-13 LAB — BASIC METABOLIC PANEL
ANION GAP: 8 (ref 5–15)
BUN: 11 mg/dL (ref 6–20)
CHLORIDE: 100 mmol/L — AB (ref 101–111)
CO2: 30 mmol/L (ref 22–32)
Calcium: 9.4 mg/dL (ref 8.9–10.3)
Creatinine, Ser: 0.82 mg/dL (ref 0.44–1.00)
GFR calc non Af Amer: >60 mL/min (ref >60)
Glucose, Bld: 93 mg/dL (ref 65–99)
Potassium: 2.6 mmol/L — CL (ref 3.5–5.1)
Sodium: 138 mmol/L (ref 135–145)

## 2015-03-13 LAB — TSH: TSH: 4.305 u[IU]/mL (ref 0.350–4.500)

## 2015-03-13 LAB — MAGNESIUM: MAGNESIUM: 2 mg/dL (ref 1.7–2.4)

## 2015-03-13 MED ORDER — SODIUM CHLORIDE 0.9 % IJ SOLN
3.0000 mL | Freq: Two times a day (BID) | INTRAMUSCULAR | Status: DC
  Administered 2015-03-13: 3 mL via INTRAVENOUS

## 2015-03-13 MED ORDER — ENOXAPARIN SODIUM 40 MG/0.4ML ~~LOC~~ SOLN
40.0000 mg | SUBCUTANEOUS | Status: DC
  Administered 2015-03-13 – 2015-03-14 (×2): 40 mg via SUBCUTANEOUS
  Filled 2015-03-13 (×2): qty 0.4

## 2015-03-13 MED ORDER — HYDROCODONE-ACETAMINOPHEN 5-325 MG PO TABS
1.0000 | ORAL_TABLET | ORAL | Status: DC | PRN
  Administered 2015-03-13: 1 via ORAL
  Filled 2015-03-13: qty 1

## 2015-03-13 MED ORDER — DOXYCYCLINE HYCLATE 100 MG IV SOLR
100.0000 mg | Freq: Two times a day (BID) | INTRAVENOUS | Status: DC
Start: 2015-03-13 — End: 2015-03-14
  Administered 2015-03-13 – 2015-03-14 (×3): 100 mg via INTRAVENOUS
  Filled 2015-03-13 (×5): qty 100

## 2015-03-13 MED ORDER — METHADONE HCL 10 MG PO TABS
10.0000 mg | ORAL_TABLET | Freq: Four times a day (QID) | ORAL | Status: DC
  Administered 2015-03-13 – 2015-03-14 (×6): 10 mg via ORAL
  Filled 2015-03-13 (×7): qty 1

## 2015-03-13 MED ORDER — CYCLOBENZAPRINE HCL 10 MG PO TABS
10.0000 mg | ORAL_TABLET | Freq: Three times a day (TID) | ORAL | Status: DC | PRN
  Administered 2015-03-13 – 2015-03-14 (×3): 10 mg via ORAL
  Filled 2015-03-13 (×3): qty 1

## 2015-03-13 MED ORDER — ONDANSETRON HCL 4 MG/2ML IJ SOLN: 4.0000 mg | Freq: Four times a day (QID) | INTRAMUSCULAR | Status: DC | PRN

## 2015-03-13 MED ORDER — DIPHENHYDRAMINE HCL 50 MG/ML IJ SOLN
25.0000 mg | Freq: Once | INTRAMUSCULAR | Status: AC
  Administered 2015-03-13: 25 mg via INTRAVENOUS
  Filled 2015-03-13: qty 1

## 2015-03-13 MED ORDER — CLINDAMYCIN PHOSPHATE 600 MG/50ML IV SOLN
600.0000 mg | Freq: Once | INTRAVENOUS | Status: AC
  Administered 2015-03-13: 600 mg via INTRAVENOUS
  Filled 2015-03-13: qty 50

## 2015-03-13 MED ORDER — ACETAMINOPHEN 650 MG RE SUPP: 650.0000 mg | Freq: Four times a day (QID) | RECTAL | Status: DC | PRN

## 2015-03-13 MED ORDER — POTASSIUM CHLORIDE CRYS ER 20 MEQ PO TBCR: 40.0000 meq | EXTENDED_RELEASE_TABLET | ORAL | Status: DC | PRN

## 2015-03-13 MED ORDER — VANCOMYCIN HCL IN DEXTROSE 1-5 GM/200ML-% IV SOLN
1000.0000 mg | Freq: Once | INTRAVENOUS | Status: DC
  Administered 2015-03-13: 1000 mg via INTRAVENOUS
  Filled 2015-03-13: qty 200

## 2015-03-13 MED ORDER — FUROSEMIDE 40 MG PO TABS: 40.0000 mg | ORAL_TABLET | Freq: Every day | ORAL | Status: DC | PRN

## 2015-03-13 MED ORDER — ACETAMINOPHEN 325 MG PO TABS: 650.0000 mg | ORAL_TABLET | Freq: Four times a day (QID) | ORAL | Status: DC | PRN

## 2015-03-13 MED ORDER — ONDANSETRON HCL 4 MG PO TABS: 4.0000 mg | ORAL_TABLET | Freq: Four times a day (QID) | ORAL | Status: DC | PRN

## 2015-03-13 NOTE — ED Provider Notes (Signed)
[facility] Emergency Department Provider Note  ____________________________________________  Time seen: Approximately 341 AM  I have reviewed the triage vital signs and the nursing notes.   HISTORY  Chief Complaint Abscess    HPI Amy Brown is a 44 y.o. female reports that she was bitten by something on her stomach. The patient reports that the area is swollen and is opened up. She reports it started draining but the redness seems to be getting worse and the infection seems to be getting deeper and deeper. The patient reports that today the area was draining black stuff which concerned the patient. The patient was here on Friday was given a shot as well as some antibiotics and told she had a skin infection. She reports there was not a bump to at that time. The patient reports that she's been taken antibiotics but the area so has gotten worse. She reports her pain is 8 out of 10 in intensity. She reports that she has fevers that, and go. The patient reports that she gets hot and sweaty as well as has some chills. The patient has a history of back pain as well.   Past Medical History  Diagnosis Date  . Chronic back pain   . Hypothyroidism     Patient Active Problem List   Diagnosis Date Noted  . Chronic back pain 03/13/2015  . Hypothyroidism 03/13/2015  . Hypokalemia 03/13/2015    Past Surgical History  Procedure Laterality Date  . No past surgeries      Current Outpatient Rx  Name  Route  Sig  Dispense  Refill  . clindamycin (CLEOCIN) 150 MG capsule   Oral   Take 2 capsules (300 mg total) by mouth 3 (three) times daily.   60 capsule   0   . cyclobenzaprine (FLEXERIL) 10 MG tablet   Oral   Take 10 mg by mouth 3 (three) times daily as needed for muscle spasms.         . furosemide (LASIX) 40 MG tablet   Oral   Take 40 mg by mouth daily.         Marland Kitchen HYDROcodone-acetaminophen (NORCO/VICODIN) 5-325 MG per tablet   Oral   Take 1 tablet by  mouth every 4 (four) hours as needed for moderate pain.   15 tablet   0   . ketorolac (TORADOL) 10 MG tablet   Oral   Take 10 mg by mouth every 6 (six) hours as needed.         . meloxicam (MOBIC) 15 MG tablet   Oral   Take 15 mg by mouth daily.         . methadone (DOLOPHINE) 10 MG tablet   Oral   Take 10 mg by mouth every 6 (six) hours.           Allergies Vancomycin  Family History  Problem Relation Age of Onset  . Diabetes    . CAD      Social History Social History  Substance Use Topics  . Smoking status: Never Smoker   . Smokeless tobacco: None  . Alcohol Use: No    Review of Systems Constitutional: No fever/chills Eyes: No visual changes. ENT: No sore throat. Cardiovascular: Denies chest pain. Respiratory: Denies shortness of breath. Gastrointestinal: No abdominal pain.  No nausea, no vomiting.  No diarrhea.  No constipation. Genitourinary: Negative for dysuria. Musculoskeletal:  back pain. Skin: Erythema and open wound on her abdomen Neurological: Negative for headaches, focal  weakness or numbness.  10-point ROS otherwise negative.  ____________________________________________   PHYSICAL EXAM:  VITAL SIGNS: ED Triage Vitals  Enc Vitals Group     BP 03/12/15 2251 165/93 mmHg     Pulse Rate 03/12/15 2251 88     Resp 03/12/15 2251 16     Temp 03/12/15 2251 97.8 F (36.6 C)     Temp Source 03/12/15 2251 Oral     SpO2 03/12/15 2251 100 %     Weight 03/12/15 2251 242 lb (109.77 kg)     Height 03/12/15 2251  (1.727 m)     Head Cir --      Peak Flow --      Pain Score 03/12/15 2252 7     Pain Loc --      Pain Edu? --      Excl. in GC? --     Constitutional: Alert and oriented. Well appearing and in mild distress. Eyes: Conjunctivae are normal. PERRL. EOMI. Head: Atraumatic. Nose: No congestion/rhinnorhea. Mouth/Throat: Mucous membranes are moist.  Oropharynx non-erythematous. Cardiovascular: Normal rate, regular rhythm. Grossly  normal heart sounds.  Good peripheral circulation. Respiratory: Normal respiratory effort.  No retractions. Lungs CTAB. Gastrointestinal: Soft and nontender. No distention. Positive bowel sounds Musculoskeletal: No lower extremity tenderness nor edema.   Neurologic:  Normal speech and language.  Skin:  Erythema to right abdomen with an open wound containing purulent material Psychiatric: Mood and affect are normal.   ____________________________________________   LABS (all labs ordered are listed, but only abnormal results are displayed)  Labs Reviewed  CBC - Abnormal; Notable for the following:    MCV 75.7 (*)    MCH 25.3 (*)    RDW 16.5 (*)    All other components within normal limits  BASIC METABOLIC PANEL - Abnormal; Notable for the following:    Potassium 2.6 (*)    Chloride 100 (*)    All other components within normal limits  CULTURE, BLOOD (ROUTINE X 2)  CULTURE, BLOOD (ROUTINE X 2)   ____________________________________________  EKG  none ____________________________________________  RADIOLOGY  none ____________________________________________   PROCEDURES  Procedure(s) performed: None  Critical Care performed: No  ____________________________________________   INITIAL IMPRESSION / ASSESSMENT AND PLAN / ED COURSE  Pertinent labs & imaging results that were available during my care of the patient were reviewed by me and considered in my medical decision making (see chart for details).  This is a 44 year old female who came in with a bug bite to her abdomen and has been taking clindamycin. The patient's abdomen has a large wound with a surrounding area of erythema with a concern for cellulitis. The area did not improve with the antibiotics at the patient was taking at home. At this time the patient has failed outpatient treatment of her cellulitis and needs to be admitted to the hospital. I did give the patient a dose of vancomycin but as the patient  started receiving the vancomycin she developed hives on her arm. At this time I discontinue the vancomycin and gave the patient Benadryl and IV clindamycin. Otherwise the patient has no further complaints or concerns she'll be admitted to the hospitalist service. ____________________________________________   FINAL CLINICAL IMPRESSION(S) / ED DIAGNOSES  Final diagnoses:  Cellulitis of abdominal wall      Rebecka Apley, MD 03/13/15 [phone]

## 2015-03-13 NOTE — Progress Notes (Signed)
[facility] - [place] at [facility]   PATIENT NAME: Amy Brown    MR#:  [id number]  DATE OF BIRTH:  Oct 04, 1970  SUBJECTIVE:  CHIEF COMPLAINT:  Patient is resting comfortably. Reporting spider bite following which he started noticing redness and around the wound. She also reported multiple insect bites on her abdomen  REVIEW OF SYSTEMS:  CONSTITUTIONAL: No fever, fatigue or weakness.  EYES: No blurred or double vision.  EARS, NOSE, AND THROAT: No tinnitus or ear pain.  RESPIRATORY: No cough, shortness of breath, wheezing or hemoptysis.  CARDIOVASCULAR: No chest pain, orthopnea, edema.  GASTROINTESTINAL: No nausea, vomiting, diarrhea or abdominal pain.  GENITOURINARY: No dysuria, hematuria.  ENDOCRINE: No polyuria, nocturia,  HEMATOLOGY: No anemia, easy bruising or bleeding SKIN: No rash or lesion. Wound on the right lower part of the abdomen with pus MUSCULOSKELETAL: No joint pain or arthritis.   NEUROLOGIC: No tingling, numbness, weakness.  PSYCHIATRY: No anxiety or depression.   DRUG ALLERGIES:   Allergies  Allergen Reactions  . Vancomycin Hives    VITALS:  Blood pressure 121/62, pulse 92, temperature 97.6 F (36.4 C), temperature source Oral, resp. rate 16, height  (1.727 m), weight 109.77 kg (242 lb), last menstrual period 02/17/2015, SpO2 100 %.  PHYSICAL EXAMINATION:  GENERAL:  44 y.o.-year-old patient lying in the bed with no acute distress.  EYES: Pupils equal, round, reactive to light and accommodation. No scleral icterus. Extraocular muscles intact.  HEENT: Head atraumatic, normocephalic. Oropharynx and nasopharynx clear.  NECK:  Supple, no jugular venous distention. No thyroid enlargement, no tenderness.  LUNGS: Normal breath sounds bilaterally, no wheezing, rales,rhonchi or crepitation. No use of accessory muscles of respiration.  CARDIOVASCULAR: S1, S2 normal. No murmurs, rubs, or gallops.  ABDOMEN: Soft, nontender, nondistended.  Right lower abdomen with 2 x 3 cm oval shaped open wound with purulent discharge and cellulitis around with some induration and tenderness. Several bite marks were present in different healing phases Bowel sounds present. No organomegaly or mass.  EXTREMITIES: No pedal edema, cyanosis, or clubbing.  NEUROLOGIC: Cranial nerves II through XII are intact. Muscle strength 5/5 in all extremities. Sensation intact. Gait not checked.  PSYCHIATRIC: The patient is alert and oriented x 3.  SKIN: No obvious rash, lesion, or ulcer.    LABORATORY PANEL:   CBC  Recent Labs Lab 03/13/15 0337  WBC 10.5  HGB 12.1  HCT 36.1  PLT 380   ------------------------------------------------------------------------------------------------------------------  Chemistries   Recent Labs Lab 03/13/15 0337  NA 138  K 2.6*  CL 100*  CO2 30  GLUCOSE 93  BUN 11  CREATININE 0.82  CALCIUM 9.4  MG 2.0   ------------------------------------------------------------------------------------------------------------------  Cardiac Enzymes No results for input(s): TROPONINI in the last 168 hours. ------------------------------------------------------------------------------------------------------------------  RADIOLOGY:  No results found.  EKG:  No orders found for this or any previous visit.  ASSESSMENT AND PLAN:   1. Abdominal wall cellulitis following spider bite, however it is possible the patient is hypersensitive to insect bites or infected insect bites. Patient failed outpatient clindamycin, we will do IV doxycycline for now Pending infectious disease and surgery consults  Will get wound culture and sensitivity  2. Chronic back pain - stable chronic problem, continue home pain medication regimen  3. Hypokalemia - low at 2.6, check magnesium level and replace potassium with serial checks until it is within normal limits   4. Bilateral lower extremity edema - Intermittent problem per the patient,  she takes Lasix when necessary at  home for the same, continue this medication here as needed 5. Hypothyroidism - patient is not on thyroid replacement, check TSH.       All the records are reviewed and case discussed with Care Management/Social Workerr. Management plans discussed with the patient, family and they are in agreement.  CODE STATUS: Full code  TOTAL TIME TAKING CARE OF THIS PATIENT: 35  minutes.   POSSIBLE D/C IN 1-2  DAYS, DEPENDING ON CLINICAL CONDITION.   Ramonita Lab M.D on 03/13/2015 at 2:18 PM  Between 7am to 6pm - Pager - [phone] After 6pm go to [email or web address] - password [email or web address] [facility]  [facility]  Office  [phone]  CC: Primary care physician; Dortha Kern, MD

## 2015-03-13 NOTE — H&P (Signed)
[facility] - [place] at [facility]   PATIENT NAME: Amy Brown    MR#:  [id number]  DATE OF BIRTH:  21-May-1971  DATE OF ADMISSION:  03/13/2015  PRIMARY CARE PHYSICIAN: BLISS, Doreene Nest, MD   REQUESTING/REFERRING PHYSICIAN: Zenda Alpers, M.D.  CHIEF COMPLAINT:   Chief Complaint  Patient presents with  . Abscess    HISTORY OF PRESENT ILLNESS:  Amy Brown  is a 44 y.o. female who presents with purulent abdominal wall cellulitis. Patient states that this began about 6 days ago on her right-sided abdomen. She has multiple areas of crusted ulceration with surrounding erythema that she says are likely bug bites. She says that she had the same thing happen a few years ago on her left arm and it was thought to be a spider bite with subsequent cellulitis. At that time she was given vancomycin and her cellulitis resolved. Today in the ED she has one area of severe ulceration with purulence her left lower abdomen, with multiple surrounding smaller ulcerations and smaller areas of erythema. She was started on IV vancomycin in the ED, but began to develop hives so this was stopped. She was given a dose of IV clindamycin, although she had been taking by mouth clindamycin outpatient the last 6 days with minimal effect as her cellulitis still spread. Hospitalists were called for admission for abdominal wall cellulitis with failed outpatient therapy.  PAST MEDICAL HISTORY:   Past Medical History  Diagnosis Date  . Chronic back pain   . Hypothyroidism     PAST SURGICAL HISTORY:   Past Surgical History  Procedure Laterality Date  . No past surgeries      SOCIAL HISTORY:   Social History  Substance Use Topics  . Smoking status: Never Smoker   . Smokeless tobacco: Not on file  . Alcohol Use: No    FAMILY HISTORY:   Family History  Problem Relation Age of Onset  . Diabetes    . CAD      DRUG ALLERGIES:   Allergies  Allergen Reactions  . Vancomycin Hives     MEDICATIONS AT HOME:   Prior to Admission medications   Medication Sig Start Date End Date Taking? Authorizing Provider  clindamycin (CLEOCIN) 150 MG capsule Take 2 capsules (300 mg total) by mouth 3 (three) times daily. 03/10/15   Tommi Rumps, PA-C  cyclobenzaprine (FLEXERIL) 10 MG tablet Take 10 mg by mouth 3 (three) times daily as needed for muscle spasms.    Historical Provider, MD  furosemide (LASIX) 40 MG tablet Take 40 mg by mouth daily.    Historical Provider, MD  HYDROcodone-acetaminophen (NORCO/VICODIN) 5-325 MG per tablet Take 1 tablet by mouth every 4 (four) hours as needed for moderate pain. 03/10/15   Tommi Rumps, PA-C  ketorolac (TORADOL) 10 MG tablet Take 10 mg by mouth every 6 (six) hours as needed.    Historical Provider, MD  meloxicam (MOBIC) 15 MG tablet Take 15 mg by mouth daily.    Historical Provider, MD  methadone (DOLOPHINE) 10 MG tablet Take 10 mg by mouth every 6 (six) hours.    Historical Provider, MD    REVIEW OF SYSTEMS:  Review of Systems  Constitutional: Negative for fever, chills, weight loss and malaise/fatigue.  HENT: Negative for ear pain, hearing loss and tinnitus.   Eyes: Negative for blurred vision, double vision, pain and redness.  Respiratory: Negative for cough, hemoptysis and shortness of breath.   Cardiovascular: Negative for chest pain, palpitations,  orthopnea and leg swelling.  Gastrointestinal: Negative for nausea, vomiting, abdominal pain, diarrhea and constipation.  Genitourinary: Negative for dysuria, frequency and hematuria.  Musculoskeletal: Negative for back pain, joint pain and neck pain.  Skin:       See history of present illness, multiple abdominal wall crusted ulcerations with one large lower abdominal wall purulent ulceration with surrounding erythema, warmth and tenderness  Neurological: Negative for dizziness, tremors, focal weakness and weakness.  Endo/Heme/Allergies: Negative for polydipsia. Does not bruise/bleed  easily.  Psychiatric/Behavioral: Negative for depression. The patient is not nervous/anxious and does not have insomnia.      VITAL SIGNS:   Filed Vitals:   03/12/15 2251 03/13/15 0341  BP: 165/93 137/86  Pulse: 88 90  Temp: 97.8 F (36.6 C) 97.7 F (36.5 C)  TempSrc: Oral   Resp: 16 16  Height:  (1.727 m)   Weight: 109.77 kg (242 lb)   SpO2: 100% 100%   Wt Readings from Last 3 Encounters:  03/12/15 109.77 kg (242 lb)  03/10/15 109.77 kg (242 lb)  02/09/15 111.131 kg (245 lb)    PHYSICAL EXAMINATION:  Physical Exam  Vitals reviewed. Constitutional: She is oriented to person, place, and time. She appears well-developed and well-nourished. No distress.  HENT:  Head: Normocephalic and atraumatic.  Mouth/Throat: Oropharynx is clear and moist.  Eyes: Conjunctivae and EOM are normal. Pupils are equal, round, and reactive to light. No scleral icterus.  Neck: Normal range of motion. Neck supple. No JVD present. No thyromegaly present.  Cardiovascular: Normal rate, regular rhythm and intact distal pulses.  Exam reveals no gallop and no friction rub.   No murmur heard. Respiratory: Effort normal and breath sounds normal. No respiratory distress. She has no wheezes. She has no rales.  GI: Soft. Bowel sounds are normal. She exhibits no distension. There is no tenderness.  Musculoskeletal: Normal range of motion. She exhibits no edema.  No arthritis, no gout  Lymphadenopathy:    She has no cervical adenopathy.  Neurological: She is alert and oriented to person, place, and time. No cranial nerve deficit.  No dysarthria, no aphasia  Skin: Skin is warm and dry. No rash noted. There is erythema (Multiple abdominal wall crusted ulcerations with surrounding erythema with a large right lower abdominal wall ulceration with purulence and surrounding erythema and warmth and tenderness).  Psychiatric: She has a normal mood and affect. Her behavior is normal. Judgment and thought content  normal.    LABORATORY PANEL:   CBC  Recent Labs Lab 03/13/15 0337  WBC 10.5  HGB 12.1  HCT 36.1  PLT 380   ------------------------------------------------------------------------------------------------------------------  Chemistries   Recent Labs Lab 03/13/15 0337  NA 138  K 2.6*  CL 100*  CO2 30  GLUCOSE 93  BUN 11  CREATININE 0.82  CALCIUM 9.4   ------------------------------------------------------------------------------------------------------------------  Cardiac Enzymes No results for input(s): TROPONINI in the last 168 hours. ------------------------------------------------------------------------------------------------------------------  RADIOLOGY:  No results found.  EKG:  No orders found for this or any previous visit.  IMPRESSION AND PLAN:  Principal Problem:   Abdominal wall cellulitis - of unclear etiology at this time, however it is possible the patient is hypersensitive to insect bites or easily infected as multiple lesions do have the appearance of potential insect bites. Patient failed outpatient clindamycin, we will do IV doxycycline for now, and get an infectious disease consult as it would likely be preferable to have her on something like the nasal at or even Ceftaroline ideally.  Active  Problems:   Chronic back pain - stable chronic problem, continue home pain medication regimen    Hypokalemia - low at 2.6, check magnesium level and replace potassium with serial checks until it is within normal limits    Bilateral lower extremity edema - Intermittent problem per the patient, she takes Lasix when necessary at home for the same, continue this medication here as needed   Hypothyroidism - History of the same, on chart review only one TSH found and it was at a normal level, patient is not on thyroid replacement, check TSH. To clarify this question.  All the records are reviewed and case discussed with ED provider. Management plans discussed  with the patient and/or family.  DVT PROPHYLAXIS: SubQ lovenox  ADMISSION STATUS: Inpatient  CODE STATUS: Full  TOTAL TIME TAKING CARE OF THIS PATIENT: 45 minutes.    Samir Ishaq FIELDING 03/13/2015, 6:13 AM  [facility]  Office  [phone]  CC: Primary care physician; Dortha Kern, MD

## 2015-03-13 NOTE — ED Notes (Signed)
Patient reports being seen for abdominal abscess on Friday. Patient has been taking abx as prescribed but has noticed the wound getting worse. Patient reports the redness has spread and she is having chills. Patient reports today black liquid came from wound and since then it has been draining pink liquid. Patient denies purulent drainage. Warm, red and firm around the wound extending out.

## 2015-03-13 NOTE — ED Notes (Signed)
Patient called this RN to room. Patient has warm red raised area to left of PIV. Vancomycin d/c.  Appears to be allergic reaction. MD aware and medications orders changed.

## 2015-03-14 LAB — CBC
HEMATOCRIT: 33.6 % — AB (ref 35.0–47.0)
HEMOGLOBIN: 11 g/dL — AB (ref 12.0–16.0)
MCH: 25.6 pg — ABNORMAL LOW (ref 26.0–34.0)
MCHC: 32.8 g/dL (ref 32.0–36.0)
MCV: 78 fL — ABNORMAL LOW (ref 80.0–100.0)
Platelets: 344 10*3/uL (ref 150–440)
RBC: 4.31 MIL/uL (ref 3.80–5.20)
RDW: 16.1 % — ABNORMAL HIGH (ref 11.5–14.5)
WBC: 7.2 10*3/uL (ref 3.6–11.0)

## 2015-03-14 LAB — COMPREHENSIVE METABOLIC PANEL
ALBUMIN: 3.1 g/dL — AB (ref 3.5–5.0)
ALK PHOS: 90 U/L (ref 38–126)
ALT: 15 U/L (ref 14–54)
AST: 19 U/L (ref 15–41)
Anion gap: 6 (ref 5–15)
BILIRUBIN TOTAL: 0.3 mg/dL (ref 0.3–1.2)
BUN: 11 mg/dL (ref 6–20)
CALCIUM: 8.8 mg/dL — AB (ref 8.9–10.3)
CO2: 32 mmol/L (ref 22–32)
Chloride: 102 mmol/L (ref 101–111)
Creatinine, Ser: 0.84 mg/dL (ref 0.44–1.00)
GFR calc Af Amer: >60 mL/min (ref >60)
GFR calc non Af Amer: >60 mL/min (ref >60)
GLUCOSE: 93 mg/dL (ref 65–99)
Potassium: 3.1 mmol/L — ABNORMAL LOW (ref 3.5–5.1)
Sodium: 140 mmol/L (ref 135–145)
TOTAL PROTEIN: 6.6 g/dL (ref 6.5–8.1)

## 2015-03-14 MED ORDER — DOXYCYCLINE HYCLATE 100 MG PO TABS: 100.0000 mg | ORAL_TABLET | Freq: Two times a day (BID) | ORAL | Status: DC

## 2015-03-14 MED ORDER — SILVER SULFADIAZINE 1 % EX CREA
TOPICAL_CREAM | Freq: Once | CUTANEOUS | Status: DC
  Filled 2015-03-14: qty 85

## 2015-03-14 MED ORDER — SILVER SULFADIAZINE 1 % EX CREA: 1.0000 "application " | TOPICAL_CREAM | Freq: Every day | CUTANEOUS | Status: DC

## 2015-03-14 NOTE — Progress Notes (Signed)
Pt meets all criteria for switching from IV to PO.  Will transition patient from IV doxycycline to PO doxycycline beginning 9/6 PM.   Amy Brown 03/14/2015

## 2015-03-14 NOTE — Progress Notes (Signed)
Surgery consult.  Patient was admitted with synovitis or abdominal wall. This was after a long allegedly insect bite 1 week ago. She was treated as an outpatient to the emergency room with both intravenous and oral antibiotics and then was admitted to the medical service yesterday for continued intravenous antibiotics. Surgical services were asked to evaluate the wound for potential incision and drainage.  Physical examination morbidly obese white female. Multiple family members are present. There is a 2 cm ulcer on the right lateral abdominal wall. There is some early cellulitis but certainly no abscess to drain. There other punctate areas of folliculitis.  Impression resolving insect bite with abdominal wall cellulitis. There is no indication for incision and drainage.  Recommendations patient can be discharged home on oral antibiotics. She can follow up with [place] in the office as needed.

## 2015-03-14 NOTE — Discharge Summary (Signed)
[facility] - [place] at [facility]   PATIENT NAME: Amy Brown    MR#:  [id number]  DATE OF BIRTH:  06-22-1971  DATE OF ADMISSION:  03/13/2015 ADMITTING PHYSICIAN: Oralia Manis, MD  DATE OF DISCHARGE: 03/14/2015  PRIMARY CARE PHYSICIAN: BLISS, Doreene Nest, MD    ADMISSION DIAGNOSIS:  Cellulitis of abdominal wall [L03.311]  DISCHARGE DIAGNOSIS:  Cellilutis of abdominal wall s/p insect bite  SECONDARY DIAGNOSIS:   Past Medical History  Diagnosis Date  . Chronic back pain   . Hypothyroidism     HOSPITAL COURSE:   *Abdominal wall cellulitis - of unclear etiology at this time, however it is possible the patient is hypersensitive to insect bites or easily infected as multiple lesions do have the appearance of potential insect bites.  -improvong. Wbc normal. Seen by Dr bird. No rec other than use Silvadene cream locally and po doxy for 10 days  * Chronic back pain - stable chronic problem, continue home pain medication regimen    *Hypokalemia - low at 2.6,repelted  * Bilateral lower extremity edema - Intermittent problem per the patient, she takes Lasix when necessary at home for the same, continue this medication here as needed   Hypothyroidism - History of the same, on chart review only one TSH found and it was at a normal level, patient is not on thyroid replacement,  TSH wnl.  -overall stable D/c home  DISCHARGE CONDITIONS:   fair  CONSULTS OBTAINED:  Natale Lay, MD  DRUG ALLERGIES:   Allergies  Allergen Reactions  . Vancomycin Hives    DISCHARGE MEDICATIONS:   Current Discharge Medication List    START taking these medications   Details  doxycycline (VIBRA-TABS) 100 MG tablet Take 1 tablet (100 mg total) by mouth every 12 (twelve) hours. Qty: 20 tablet, Refills: 0    silver sulfADIAZINE (SILVADENE) 1 % cream Apply 1 application topically daily. Qty: 50 g, Refills: 0      CONTINUE these medications which have NOT CHANGED    Details  clindamycin (CLEOCIN) 150 MG capsule Take 2 capsules (300 mg total) by mouth 3 (three) times daily. Qty: 60 capsule, Refills: 0    cyclobenzaprine (FLEXERIL) 10 MG tablet Take 10 mg by mouth 3 (three) times daily as needed for muscle spasms.    furosemide (LASIX) 40 MG tablet Take 40 mg by mouth daily.    HYDROcodone-acetaminophen (NORCO/VICODIN) 5-325 MG per tablet Take 1 tablet by mouth every 4 (four) hours as needed for moderate pain. Qty: 15 tablet, Refills: 0    ketorolac (TORADOL) 10 MG tablet Take 10 mg by mouth every 6 (six) hours as needed.    meloxicam (MOBIC) 15 MG tablet Take 15 mg by mouth daily.    methadone (DOLOPHINE) 10 MG tablet Take 10 mg by mouth every 6 (six) hours.        If you experience worsening of your admission symptoms, develop shortness of breath, life threatening emergency, suicidal or homicidal thoughts you must seek medical attention immediately by calling 911 or calling your MD immediately  if symptoms less severe.  You Must read complete instructions/literature along with all the possible adverse reactions/side effects for all the Medicines you take and that have been prescribed to you. Take any new Medicines after you have completely understood and accept all the possible adverse reactions/side effects.   Please note  You were cared for by a hospitalist during your hospital stay. If you have any questions about your discharge medications  or the care you received while you were in the hospital after you are discharged, you can call the unit and asked to speak with the hospitalist on call if the hospitalist that took care of you is not available. Once you are discharged, your primary care physician will handle any further medical issues. Please note that NO REFILLS for any discharge medications will be authorized once you are discharged, as it is imperative that you return to your primary care physician (or establish a relationship with a primary  care physician if you do not have one) for your aftercare needs so that they can reassess your need for medications and monitor your lab values. Today   SUBJECTIVE   Doing well  VITAL SIGNS:  Blood pressure 120/57, pulse 72, temperature 97.8 F (36.6 C), temperature source Oral, resp. rate 18, height  (1.727 m), weight 109.77 kg (242 lb), last menstrual period 02/17/2015, SpO2 99 %.  I/O:   Intake/Output Summary (Last 24 hours) at 03/14/15 1352 Last data filed at 03/14/15 1216  Gross per 24 hour  Intake    980 ml  Output      0 ml  Net    980 ml    PHYSICAL EXAMINATION:  GENERAL:  44 y.o.-year-old patient lying in the bed with no acute distress.  EYES: Pupils equal, round, reactive to light and accommodation. No scleral icterus. Extraocular muscles intact.  HEENT: Head atraumatic, normocephalic. Oropharynx and nasopharynx clear.  NECK:  Supple, no jugular venous distention. No thyroid enlargement, no tenderness.  LUNGS: Normal breath sounds bilaterally, no wheezing, rales,rhonchi or crepitation. No use of accessory muscles of respiration.  CARDIOVASCULAR: S1, S2 normal. No murmurs, rubs, or gallops.  ABDOMEN: Soft, non-tender, non-distended. Bowel sounds present. No organomegaly or mass. Small ulcers over the abdominal wall with mild redness-resolving EXTREMITIES: No pedal edema, cyanosis, or clubbing.  NEUROLOGIC: Cranial nerves II through XII are intact. Muscle strength 5/5 in all extremities. Sensation intact. Gait not checked.  PSYCHIATRIC: The patient is alert and oriented x 3.  SKIN: No obvious rash, or ulcer. As bove  DATA REVIEW:   CBC   Recent Labs Lab 03/14/15 0440  WBC 7.2  HGB 11.0*  HCT 33.6*  PLT 344    Chemistries   Recent Labs Lab 03/13/15 0337 03/14/15 0440  NA 138 140  K 2.6* 3.1*  CL 100* 102  CO2 30 32  GLUCOSE 93 93  BUN 11 11  CREATININE 0.82 0.84  CALCIUM 9.4 8.8*  MG 2.0  --   AST  --  19  ALT  --  15  ALKPHOS  --  90   BILITOT  --  0.3    Microbiology Results   Recent Results (from the past 240 hour(s))  Blood culture (routine x 2)     Status: None (Preliminary result)   Collection Time: 03/13/15  4:13 AM  Result Value Ref Range Status   Specimen Description BLOOD LEFT ARM  Final   Special Requests BOTTLES DRAWN AEROBIC AND ANAEROBIC 4CC  Final   Culture NO GROWTH 1 DAY  Final   Report Status PENDING  Incomplete  Blood culture (routine x 2)     Status: None (Preliminary result)   Collection Time: 03/13/15  4:15 AM  Result Value Ref Range Status   Specimen Description BLOOD RIGHT ARM  Final   Special Requests BOTTLES DRAWN AEROBIC AND ANAEROBIC 4CC  Final   Culture NO GROWTH 1 DAY  Final   Report  Status PENDING  Incomplete  Wound culture     Status: None (Preliminary result)   Collection Time: 03/13/15  2:36 PM  Result Value Ref Range Status   Specimen Description ABDOMEN  Final   Special Requests Normal  Final   Gram Stain TOO YOUNG TO READ  Final   Culture PENDING  Incomplete   Report Status PENDING  Incomplete    RADIOLOGY:  No results found.   Management plans discussed with the patient, family and they are in agreement.  CODE STATUS:     Code Status Orders        Start     Ordered   03/13/15 0710  Full code   Continuous     03/13/15 0709      TOTAL TIME TAKING CARE OF THIS PATIENT: 40 minutes.    Eliezer Khawaja M.D on 03/14/2015 at 1:52 PM  Between 7am to 6pm - Pager - [phone] After 6pm go to [email or web address] - password [email or web address] [facility]  [place]  [facility]  Office  [phone]  CC: Primary care physician; Dortha Kern, MD

## 2015-03-14 NOTE — Progress Notes (Signed)
Patient discharged home, instructions and prescriptions given to patient, verbalized understanding. Family to provide transportation.

## 2015-03-16 NOTE — Progress Notes (Signed)
Called by lab 1/2 BC GPR in aerobic bottle

## 2015-03-17 LAB — WOUND CULTURE: Special Requests: NORMAL

## 2015-03-18 LAB — CULTURE, BLOOD (ROUTINE X 2): Culture: NO GROWTH

## 2017-09-08 ENCOUNTER — Encounter: Payer: Self-pay | Admitting: Emergency Medicine

## 2017-09-08 ENCOUNTER — Emergency Department: Payer: Medicare Other

## 2017-09-08 ENCOUNTER — Other Ambulatory Visit: Payer: Self-pay

## 2017-09-08 ENCOUNTER — Emergency Department
Admission: EM | Admit: 2017-09-08 | Discharge: 2017-09-08 | Disposition: A | Discharge status: Home / Self Care | Payer: Medicare Other | Attending: Emergency Medicine | Admitting: Emergency Medicine

## 2017-09-08 DIAGNOSIS — M25562 Pain in left knee: Secondary | ICD-10-CM | POA: Diagnosis present

## 2017-09-08 DIAGNOSIS — E039 Hypothyroidism, unspecified: Secondary | ICD-10-CM | POA: Insufficient documentation

## 2017-09-08 DIAGNOSIS — Z79899 Other long term (current) drug therapy: Secondary | ICD-10-CM | POA: Insufficient documentation

## 2017-09-08 DIAGNOSIS — M23232 Derangement of other medial meniscus due to old tear or injury, left knee: Secondary | ICD-10-CM | POA: Insufficient documentation

## 2017-09-08 DIAGNOSIS — M23304 Other meniscus derangements, unspecified medial meniscus, left knee: Secondary | ICD-10-CM

## 2017-09-08 MED ORDER — MELOXICAM 15 MG PO TABS: 15.0000 mg | ORAL_TABLET | Freq: Every day | ORAL | 0 refills | Status: DC

## 2017-09-08 NOTE — ED Provider Notes (Signed)
[facility] Emergency Department Provider Note  ____________________________________________  Time seen: Approximately 9:18 PM  I have reviewed the triage vital signs and the nursing notes.   HISTORY  Chief Complaint Knee Pain    HPI Amy Brown is a 47 y.o. female who presents the emergency department complaining of left knee pain.  Patient reports that approximately 2 weeks ago, she was standing outside of her truck, turned to walk away but her foot "stuck on the ground."  Patient reports that she felt a sharp popping sensation to the medial knee.  Since then, patient has experienced ongoing medial knee pain and intermittent edema.  Patient reports that she was walking today when her knee "twisted."  Patient is having increased pain to the medial knee.  Patient reports that she has a popping/clicking/grinding sensation to the medial aspect of her knee since original injury.  No history of meniscal injury.  No history of MCL, ACL, PCL, LCL injury.  Patient reports that she did have injections into her left knee from a previous injury.  No other complaints.  No medications prior to arrival.  Past Medical History:  Diagnosis Date  . Chronic back pain   . Hypothyroidism     Patient Active Problem List   Diagnosis Date Noted  . Chronic back pain 03/13/2015  . Hypothyroidism 03/13/2015  . Hypokalemia 03/13/2015  . Abdominal wall cellulitis 03/13/2015  . Bilateral lower extremity edema 03/13/2015    Past Surgical History:  Procedure Laterality Date  . NO PAST SURGERIES      Prior to Admission medications   Medication Sig Start Date End Date Taking? Authorizing Provider  clindamycin (CLEOCIN) 150 MG capsule Take 2 capsules (300 mg total) by mouth 3 (three) times daily. 03/10/15   Tommi Rumps, PA-C  cyclobenzaprine (FLEXERIL) 10 MG tablet Take 10 mg by mouth 3 (three) times daily as needed for muscle spasms.    [provider]  doxycycline  (VIBRA-TABS) 100 MG tablet Take 1 tablet (100 mg total) by mouth every 12 (twelve) hours. 03/14/15   Enedina Finner, MD  furosemide (LASIX) 40 MG tablet Take 40 mg by mouth daily.    [provider]  HYDROcodone-acetaminophen (NORCO/VICODIN) 5-325 MG per tablet Take 1 tablet by mouth every 4 (four) hours as needed for moderate pain. 03/10/15   Tommi Rumps, PA-C  ketorolac (TORADOL) 10 MG tablet Take 10 mg by mouth every 6 (six) hours as needed.    [provider]  meloxicam (MOBIC) 15 MG tablet Take 1 tablet (15 mg total) by mouth daily. 09/08/17   Juliahna Wiswell, Delorise Royals, PA-C  methadone (DOLOPHINE) 10 MG tablet Take 10 mg by mouth every 6 (six) hours.    [provider]  silver sulfADIAZINE (SILVADENE) 1 % cream Apply 1 application topically daily. 03/14/15   Enedina Finner, MD    Allergies Vancomycin  Family History  Problem Relation Age of Onset  . Diabetes Unknown   . CAD Unknown     Social History Social History   Tobacco Use  . Smoking status: Never Smoker  Substance Use Topics  . Alcohol use: No    Alcohol/week: 0.0 oz  . Drug use: No     Review of Systems  Constitutional: No fever/chills Eyes: No visual changes.  Cardiovascular: no chest pain. Respiratory: no cough. No SOB. Gastrointestinal: No abdominal pain.  No nausea, no vomiting.   Musculoskeletal: Positive for left medial knee pain Skin: Negative for rash, abrasions, lacerations,  ecchymosis. Neurological: Negative for headaches, focal weakness or numbness. 10-point ROS otherwise negative.  ____________________________________________   PHYSICAL EXAM:  VITAL SIGNS: ED Triage Vitals  Enc Vitals Group     BP 09/08/17 1857 140/82     Pulse Rate 09/08/17 1857 99     Resp 09/08/17 1857 18     Temp 09/08/17 1857 97.6 F (36.4 C)     Temp Source 09/08/17 1857 Oral     SpO2 09/08/17 1857 97 %     Weight 09/08/17 1858 220 lb (99.8 kg)     Height 09/08/17 1858 5\' 9"  (1.753 m)     Head  Circumference --      Peak Flow --      Pain Score 09/08/17 1857 8     Pain Loc --      Pain Edu? --      Excl. in GC? --      Constitutional: Alert and oriented. Well appearing and in no acute distress. Eyes: Conjunctivae are normal. PERRL. EOMI. Head: Atraumatic. Neck: No stridor.    Cardiovascular: Normal rate, regular rhythm. Normal S1 and S2.  Good peripheral circulation. Respiratory: Normal respiratory effort without tachypnea or retractions. Lungs CTAB. Good air entry to the bases with no decreased or absent breath sounds. Musculoskeletal: Full range of motion to all extremities. No gross deformities appreciated.  Left knee is slightly edematous when compared with right.  Full range of motion 1 coaxing.  Patient is very tender to palpation along the medial joint line with no palpable abnormality.  She is mildly tender to palpation in the popliteal fossa with no palpable abnormality.  No tenderness to palpation.  Palpation along the quadriceps and patellar tendon reveals no deficits.  Varus, valgus, Lachman's is negative.  McMurray's is positive for medial meniscal injury.  Dorsalis pedis pulse intact distally.  Sensation intact distally.  No significant ballottement on exam. Neurologic:  Normal speech and language. No gross focal neurologic deficits are appreciated.  Skin:  Skin is warm, dry and intact. No rash noted. Psychiatric: Mood and affect are normal. Speech and behavior are normal. Patient exhibits appropriate insight and judgement.   ____________________________________________   LABS (all labs ordered are listed, but only abnormal results are displayed)  Labs Reviewed - No data to display ____________________________________________  EKG   ____________________________________________  RADIOLOGY Festus Barren Edna Rede, personally viewed and evaluated these images (plain radiographs) as part of my medical decision making, as well as reviewing the written report by  the radiologist.  I concur with radiologist finding of small suprapatellar joint effusion without fracture.  Tricompartmental osteoarthritis.  Dg Knee Complete 4 Views Left  Result Date: 09/08/2017 CLINICAL DATA:  Knee twisting injury twice in last month. Pain and swelling. EXAM: LEFT KNEE - COMPLETE 4+ VIEW COMPARISON:  None. FINDINGS: No evidence of fracture, dislocation, or joint effusion. No destructive bony lesions. Mild tricompartmental osteoarthrosis. Small suprapatellar joint effusion. IMPRESSION: Small suprapatellar joint effusion without fracture deformity or dislocation. Mild tricompartmental osteoarthrosis. Electronically Signed   By: Awilda Metro M.D.   On: 09/08/2017 19:23    ____________________________________________    PROCEDURES  Procedure(s) performed:    .Splint Application Date/Time: 09/08/2017 9:22 PM Performed by: Racheal Patches, PA-C Authorized by: Racheal Patches, PA-C   Consent:    Consent obtained:  Verbal   Consent given by:  Patient   Risks discussed:  Pain and swelling Pre-procedure details:    Sensation:  Normal Procedure details:    Laterality:  Left   Location:  Knee   Knee:  L knee   Splint type:  Knee immobilizer   Supplies:  Prefabricated splint Post-procedure details:    Pain:  Improved   Sensation:  Normal   Patient tolerance of procedure:  Tolerated well, no immediate complications      Medications - No data to display   ____________________________________________   INITIAL IMPRESSION / ASSESSMENT AND PLAN / ED COURSE  Pertinent labs & imaging results that were available during my care of the patient were reviewed by me and considered in my medical decision making (see chart for details).  Review of the [place] CSRS was performed in accordance of the [facility] prior to dispensing any controlled drugs.     Patient's diagnosis is consistent with medial meniscal derangement.  Patient presents after twisting injury 2  weeks ago.  Patient has had ongoing symptoms and reinjured the knee this evening.  Differential included fracture versus dislocation versus ligament rupture versus meniscal tear.  Exam is most consistent with possible meniscal derangement/tear to the medial meniscus.  Patient's knee is immobilized in the emergency department and given crutches for ambulation.. Patient will be discharged home with prescriptions for meloxicam. Patient is to follow up with orthopedics as needed or otherwise directed. Patient is given ED precautions to return to the ED for any worsening or new symptoms.     ____________________________________________  FINAL CLINICAL IMPRESSION(S) / ED DIAGNOSES  Final diagnoses:  Derangement of medial meniscus of left knee      NEW MEDICATIONS STARTED DURING THIS VISIT:  ED Discharge Orders        Ordered    meloxicam (MOBIC) 15 MG tablet  Daily     09/08/17 2110          This chart was dictated using voice recognition software/Dragon. Despite best efforts to proofread, errors can occur which can change the meaning. Any change was purely unintentional.    Racheal PatchesCuthriell, Raymir Frommelt D, PA-C 09/08/17 2123    Phineas SemenGoodman, Graydon, MD 09/08/17 2328

## 2017-09-08 NOTE — ED Triage Notes (Signed)
Twisted left knee couple weeks ago. Has been having pain and swelling since. Twisted again at work tonight.

## 2018-03-06 ENCOUNTER — Other Ambulatory Visit: Payer: Self-pay | Admitting: Family Medicine

## 2018-03-06 DIAGNOSIS — R109 Unspecified abdominal pain: Secondary | ICD-10-CM

## 2018-03-06 DIAGNOSIS — R112 Nausea with vomiting, unspecified: Secondary | ICD-10-CM

## 2018-03-11 ENCOUNTER — Ambulatory Visit
Admission: RE | Admit: 2018-03-11 | Discharge: 2018-03-11 | Disposition: A | Discharge status: Home / Self Care | Payer: Medicare Other | Source: Ambulatory Visit | Attending: Family Medicine | Admitting: Family Medicine

## 2018-03-11 ENCOUNTER — Encounter (INDEPENDENT_AMBULATORY_CARE_PROVIDER_SITE_OTHER): Payer: Self-pay

## 2018-03-11 DIAGNOSIS — R109 Unspecified abdominal pain: Secondary | ICD-10-CM | POA: Diagnosis not present

## 2018-03-11 DIAGNOSIS — K8020 Calculus of gallbladder without cholecystitis without obstruction: Secondary | ICD-10-CM | POA: Diagnosis not present

## 2018-03-11 DIAGNOSIS — R112 Nausea with vomiting, unspecified: Secondary | ICD-10-CM | POA: Diagnosis not present

## 2018-03-19 ENCOUNTER — Encounter: Payer: Self-pay | Admitting: Emergency Medicine

## 2018-03-19 ENCOUNTER — Other Ambulatory Visit: Payer: Self-pay

## 2018-03-19 ENCOUNTER — Emergency Department
Admission: EM | Admit: 2018-03-19 | Discharge: 2018-03-19 | Disposition: A | Discharge status: Home / Self Care | Payer: Medicare Other | Attending: Student in an Organized Health Care Education/Training Program | Admitting: Student in an Organized Health Care Education/Training Program

## 2018-03-19 ENCOUNTER — Emergency Department: Payer: Medicare Other

## 2018-03-19 DIAGNOSIS — K920 Hematemesis: Secondary | ICD-10-CM | POA: Diagnosis present

## 2018-03-19 DIAGNOSIS — K8020 Calculus of gallbladder without cholecystitis without obstruction: Secondary | ICD-10-CM | POA: Diagnosis not present

## 2018-03-19 DIAGNOSIS — R112 Nausea with vomiting, unspecified: Secondary | ICD-10-CM

## 2018-03-19 DIAGNOSIS — E039 Hypothyroidism, unspecified: Secondary | ICD-10-CM | POA: Insufficient documentation

## 2018-03-19 DIAGNOSIS — Z79899 Other long term (current) drug therapy: Secondary | ICD-10-CM | POA: Diagnosis not present

## 2018-03-19 LAB — COMPREHENSIVE METABOLIC PANEL
ALBUMIN: 3.7 g/dL (ref 3.5–5.0)
ALT: 15 U/L (ref 0–44)
ANION GAP: 9 (ref 5–15)
AST: 25 U/L (ref 15–41)
Alkaline Phosphatase: 82 U/L (ref 38–126)
BUN: 12 mg/dL (ref 6–20)
CO2: 23 mmol/L (ref 22–32)
Calcium: 8.9 mg/dL (ref 8.9–10.3)
Chloride: 105 mmol/L (ref 98–111)
Creatinine, Ser: 0.83 mg/dL (ref 0.44–1.00)
GFR calc Af Amer: >60 mL/min (ref >60)
GFR calc non Af Amer: >60 mL/min (ref >60)
GLUCOSE: 130 mg/dL — AB (ref 70–99)
Potassium: 3.7 mmol/L (ref 3.5–5.1)
Sodium: 137 mmol/L (ref 135–145)
TOTAL PROTEIN: 7.3 g/dL (ref 6.5–8.1)
Total Bilirubin: 0.5 mg/dL (ref 0.3–1.2)

## 2018-03-19 LAB — LIPASE, BLOOD: Lipase: 21 U/L (ref 11–51)

## 2018-03-19 LAB — CBC
HCT: 39 % (ref 35.0–47.0)
HEMOGLOBIN: 13 g/dL (ref 12.0–16.0)
MCH: 26.2 pg (ref 26.0–34.0)
MCHC: 33.3 g/dL (ref 32.0–36.0)
MCV: 78.8 fL — ABNORMAL LOW (ref 80.0–100.0)
Platelets: 387 10*3/uL (ref 150–440)
RBC: 4.95 MIL/uL (ref 3.80–5.20)
RDW: 14.8 % — AB (ref 11.5–14.5)
WBC: 10.9 10*3/uL (ref 3.6–11.0)

## 2018-03-19 LAB — POCT PREGNANCY, URINE: Preg Test, Ur: NEGATIVE

## 2018-03-19 MED ORDER — HYDROCODONE-ACETAMINOPHEN 5-325 MG PO TABS: 1.0000 | ORAL_TABLET | ORAL | 0 refills | Status: DC | PRN

## 2018-03-19 MED ORDER — DICYCLOMINE HCL 10 MG PO CAPS: 10.0000 mg | ORAL_CAPSULE | Freq: Three times a day (TID) | ORAL | 0 refills | Status: DC | PRN

## 2018-03-19 MED ORDER — FENTANYL CITRATE (PF) 100 MCG/2ML IJ SOLN
50.0000 ug | INTRAMUSCULAR | Status: DC | PRN
  Administered 2018-03-19: 50 ug via INTRAVENOUS
  Filled 2018-03-19: qty 2

## 2018-03-19 MED ORDER — PROMETHAZINE HCL 25 MG PO TABS
25.0000 mg | ORAL_TABLET | Freq: Once | ORAL | Status: AC
  Administered 2018-03-19: 25 mg via ORAL
  Filled 2018-03-19: qty 1

## 2018-03-19 MED ORDER — DICYCLOMINE HCL 10 MG PO CAPS
10.0000 mg | ORAL_CAPSULE | Freq: Once | ORAL | Status: AC
  Administered 2018-03-19: 10 mg via ORAL
  Filled 2018-03-19: qty 1

## 2018-03-19 MED ORDER — SODIUM CHLORIDE 0.9 % IV BOLUS
1000.0000 mL | Freq: Once | INTRAVENOUS | Status: AC
  Administered 2018-03-19: 1000 mL via INTRAVENOUS

## 2018-03-19 MED ORDER — PROMETHAZINE HCL 25 MG/ML IJ SOLN
12.5000 mg | Freq: Four times a day (QID) | INTRAMUSCULAR | Status: DC | PRN
  Administered 2018-03-19: 12.5 mg via INTRAVENOUS
  Filled 2018-03-19: qty 1

## 2018-03-19 MED ORDER — PROMETHAZINE HCL 12.5 MG PO TABS: 12.5000 mg | ORAL_TABLET | Freq: Four times a day (QID) | ORAL | 0 refills | Status: DC | PRN

## 2018-03-19 MED ORDER — HYDROCODONE-ACETAMINOPHEN 5-325 MG PO TABS
1.0000 | ORAL_TABLET | Freq: Once | ORAL | Status: AC
  Administered 2018-03-19: 1 via ORAL
  Filled 2018-03-19: qty 1

## 2018-03-19 MED ORDER — KETOROLAC TROMETHAMINE 30 MG/ML IJ SOLN
15.0000 mg | Freq: Once | INTRAMUSCULAR | Status: AC
  Administered 2018-03-19: 15 mg via INTRAVENOUS
  Filled 2018-03-19: qty 1

## 2018-03-19 NOTE — Discharge Instructions (Signed)

## 2018-03-19 NOTE — ED Provider Notes (Signed)
[facility] Emergency Department Provider Note    First MD Initiated Contact with Patient 03/19/18 1519     (approximate)  I have reviewed the triage vital signs and the nursing notes.   HISTORY  Chief Complaint Abdominal Pain and Hematemesis    HPI Amy Brown is a 47 y.o. female presents to the ER with chief complaint of persistent worsening of epigastric and right upper quadrant pain associated with nausea vomiting and hematemesis.  Denies any pain radiating through to her back.  Has had some chills.  No measured fevers.  Did have recent ultrasound that showed evidence of cholelithiasis without evidence of cholecystitis.  She is scheduled for outpatient surgery and call to make appointment but was able to establish appointment yet and due to her worsening symptoms presented to the ER for mild to moderate discomfort today.    Past Medical History:  Diagnosis Date  . Chronic back pain   . Hypothyroidism    Family History  Problem Relation Age of Onset  . Diabetes Unknown   . CAD Unknown    Past Surgical History:  Procedure Laterality Date  . KNEE SURGERY Left   . NO PAST SURGERIES     Patient Active Problem List   Diagnosis Date Noted  . Chronic back pain 03/13/2015  . Hypothyroidism 03/13/2015  . Hypokalemia 03/13/2015  . Abdominal wall cellulitis 03/13/2015  . Bilateral lower extremity edema 03/13/2015      Prior to Admission medications   Medication Sig Start Date End Date Taking? Authorizing Provider  cyclobenzaprine (FLEXERIL) 10 MG tablet Take 10 mg by mouth 3 (three) times daily.   Yes [provider]  furosemide (LASIX) 40 MG tablet Take 40 mg by mouth 2 (two) times daily.    Yes [provider]  lisinopril (PRINIVIL,ZESTRIL) 10 MG tablet Take 10 mg by mouth daily. 02/11/18  Yes [provider]  methadone (DOLOPHINE) 10 MG tablet Take 10 mg by mouth every 12 (twelve) hours.    Yes [provider]  omeprazole (PRILOSEC) 20 MG capsule Take 20 mg by mouth 2 (two) times daily before a meal.   Yes [provider]  sucralfate (CARAFATE) 1 g tablet Take 1 g by mouth 4 (four) times daily. 03/06/18  Yes [provider]  clindamycin (CLEOCIN) 150 MG capsule Take 2 capsules (300 mg total) by mouth 3 (three) times daily. Patient not taking: Reported on 03/19/2018 03/10/15   Tommi RumpsSummers, Rhonda L, PA-C  dicyclomine (BENTYL) 10 MG capsule Take 1 capsule (10 mg total) by mouth 3 (three) times daily as needed for up to 14 days for spasms. 03/19/18 04/02/18  Willy Eddyobinson, Finneus Kaneshiro, MD  doxycycline (VIBRA-TABS) 100 MG tablet Take 1 tablet (100 mg total) by mouth every 12 (twelve) hours. Patient not taking: Reported on 03/19/2018 03/14/15   Enedina FinnerPatel, Sona, MD  HYDROcodone-acetaminophen (NORCO/VICODIN) 5-325 MG tablet Take 1 tablet by mouth every 4 (four) hours as needed for moderate pain. 03/19/18   Willy Eddyobinson, Edynn Gillock, MD  ketorolac (TORADOL) 10 MG tablet Take 10 mg by mouth every 6 (six) hours as needed.    [provider]  meloxicam (MOBIC) 15 MG tablet Take 1 tablet (15 mg total) by mouth daily. Patient not taking: Reported on 03/19/2018 09/08/17   Cuthriell, Delorise RoyalsJonathan D, PA-C  promethazine (PHENERGAN) 12.5 MG tablet Take 1 tablet (12.5 mg total) by mouth every 6 (six) hours as needed for nausea or vomiting. 03/19/18   Willy Eddyobinson, Zabella Wease, MD  silver  sulfADIAZINE (SILVADENE) 1 % cream Apply 1 application topically daily. Patient not taking: Reported on 03/19/2018 03/14/15   Enedina Finner, MD    Allergies Vancomycin    Social History Social History   Tobacco Use  . Smoking status: Never Smoker  . Smokeless tobacco: Never Used  Substance Use Topics  . Alcohol use: No    Alcohol/week: 0.0 standard drinks  . Drug use: No    Review of Systems Patient denies headaches, rhinorrhea, blurry vision, numbness, shortness of breath, chest pain, edema, cough, abdominal pain, nausea, vomiting, diarrhea,  dysuria, fevers, rashes or hallucinations unless otherwise stated above in HPI. ____________________________________________   PHYSICAL EXAM:  VITAL SIGNS: Vitals:   03/19/18 1422  BP: (!) 155/91  Pulse: (!) 110  Resp: 16  Temp: 98.4 F (36.9 C)  SpO2: 95%    Constitutional: Alert and oriented. obese Eyes: Conjunctivae are normal.  Head: Atraumatic. Nose: No congestion/rhinnorhea. Mouth/Throat: Mucous membranes are moist.   Neck: No stridor. Painless ROM.  Cardiovascular: Normal rate, regular rhythm. Grossly normal heart sounds.  Good peripheral circulation. Respiratory: Normal respiratory effort.  No retractions. Lungs CTAB. Gastrointestinal: Soft with mild epigastric ttp. No distention. No abdominal bruits. No CVA tenderness. Genitourinary:  Musculoskeletal: No lower extremity tenderness nor edema.  No joint effusions. Neurologic:  Normal speech and language. No gross focal neurologic deficits are appreciated. No facial droop Skin:  Skin is warm, dry and intact. No rash noted. Psychiatric: Mood and affect are normal. Speech and behavior are normal.  ____________________________________________   LABS (all labs ordered are listed, but only abnormal results are displayed)  Results for orders placed or performed during the hospital encounter of 03/19/18 (from the past 24 hour(s))  Lipase, blood     Status: None   Collection Time: 03/19/18  2:29 PM  Result Value Ref Range   Lipase 21 11 - 51 U/L  Comprehensive metabolic panel     Status: Abnormal   Collection Time: 03/19/18  2:29 PM  Result Value Ref Range   Sodium 137 135 - 145 mmol/L   Potassium 3.7 3.5 - 5.1 mmol/L   Chloride 105 98 - 111 mmol/L   CO2 23 22 - 32 mmol/L   Glucose, Bld 130 (H) 70 - 99 mg/dL   BUN 12 6 - 20 mg/dL   Creatinine, Ser 1.61 0.44 - 1.00 mg/dL   Calcium 8.9 8.9 - 09.6 mg/dL   Total Protein 7.3 6.5 - 8.1 g/dL   Albumin 3.7 3.5 - 5.0 g/dL   AST 25 15 - 41 U/L   ALT 15 0 - 44 U/L    Alkaline Phosphatase 82 38 - 126 U/L   Total Bilirubin 0.5 0.3 - 1.2 mg/dL   GFR calc non Af Amer >60 >60 mL/min   GFR calc Af Amer >60 >60 mL/min   Anion gap 9 5 - 15  CBC     Status: Abnormal   Collection Time: 03/19/18  2:29 PM  Result Value Ref Range   WBC 10.9 3.6 - 11.0 K/uL   RBC 4.95 3.80 - 5.20 MIL/uL   Hemoglobin 13.0 12.0 - 16.0 g/dL   HCT 04.5 40.9 - 81.1 %   MCV 78.8 (L) 80.0 - 100.0 fL   MCH 26.2 26.0 - 34.0 pg   MCHC 33.3 32.0 - 36.0 g/dL   RDW 91.4 (H) 78.2 - 95.6 %   Platelets 387 150 - 440 K/uL  Pregnancy, urine POC     Status: None   Collection  Time: 03/19/18  2:46 PM  Result Value Ref Range   Preg Test, Ur NEGATIVE NEGATIVE   ____________________________________________  EKG My review and personal interpretation at Time: 14:34   Indication: N/V  Rate: 97  Rhythm: sinus Axis: normal Other: normal intervals, no stemi ____________________________________________  RADIOLOGY  I personally reviewed all radiographic images ordered to evaluate for the above acute complaints and reviewed radiology reports and findings.  These findings were personally discussed with the patient.  Please see medical record for radiology report.  ____________________________________________   PROCEDURES  Procedure(s) performed:  Procedures    Critical Care performed: no ____________________________________________   INITIAL IMPRESSION / ASSESSMENT AND PLAN / ED COURSE  Pertinent labs & imaging results that were available during my care of the patient were reviewed by me and considered in my medical decision making (see chart for details).   DDX: Cholelithiasis, cholecystitis, cholangitis, gastritis, duodenitis, esophagitis, Mallory-Weiss, PUD  Amy Brown is a 47 y.o. who presents to the ED with symptoms as described above.  Patient nontoxic-appearing.  Symptoms have been ongoing for several days.  Blood work sent for the above differential showed no evidence of acute  anemia or leukocytosis.  No significant AKI.  Given her history of cholelithiasis and reported worsening symptoms we will repeat ultrasound to evaluate evidence of obstructive pathology.  Patient is describing blood-streaked emesis do suspect secondary to Mallory-Weiss.  No signs or symptoms of Boerhaave's.  Abdominal exam non-peritonitic and reassuring.  Will give IVFfor dehydration and IV anti-emetics and pain meds.  Clinical Course as of Mar 19 1840  Thu Mar 19, 2018  1723 Patient reassessed.  Pain improving.  No leukocytosis.  No evidence of biliary obstruction.  Ultrasound is reassuring shows no evidence of cholecystitis.  Will p.o. challenge as her pain is now improved.   [PR]  1839 Patient was able to tolerate PO and was able to ambulate with a steady gait.  Have discussed with the patient and available family all diagnostics and treatments performed thus far and all questions were answered to the best of my ability. The patient demonstrates understanding and agreement with plan.    [PR]    Clinical Course User Index [PR] Willy Eddy, MD     As part of my medical decision making, I reviewed the following data within the electronic MEDICAL RECORD NUMBER Nursing notes reviewed and incorporated, Labs reviewed, notes from prior ED visits.   ____________________________________________   FINAL CLINICAL IMPRESSION(S) / ED DIAGNOSES  Final diagnoses:  Nausea and vomiting  Calculus of gallbladder without cholecystitis without obstruction      NEW MEDICATIONS STARTED DURING THIS VISIT:  New Prescriptions   DICYCLOMINE (BENTYL) 10 MG CAPSULE    Take 1 capsule (10 mg total) by mouth 3 (three) times daily as needed for up to 14 days for spasms.   PROMETHAZINE (PHENERGAN) 12.5 MG TABLET    Take 1 tablet (12.5 mg total) by mouth every 6 (six) hours as needed for nausea or vomiting.     Note:  This document was prepared using Dragon voice recognition software and may include  unintentional dictation errors.    Willy Eddy, MD 03/19/18 [phone]

## 2018-03-19 NOTE — ED Notes (Signed)
Sent pt urine down to lab as well in case other test needed

## 2018-03-19 NOTE — ED Triage Notes (Signed)
Says she has gallstones.  Called surgeon and was told to come to ed due to increased pain all across upper abdomen and now is vomiting blood depspite omeprazol and carafate.  She could not come then, but got a ride today.

## 2018-03-25 ENCOUNTER — Ambulatory Visit (INDEPENDENT_AMBULATORY_CARE_PROVIDER_SITE_OTHER): Payer: Medicare Other | Admitting: Surgery

## 2018-03-25 ENCOUNTER — Encounter: Payer: Self-pay | Admitting: Surgery

## 2018-03-25 VITALS — BP 140/90 | HR 88 | Temp 97.5°F | Ht 69.0 in | Wt 272.0 lb

## 2018-03-25 DIAGNOSIS — K8020 Calculus of gallbladder without cholecystitis without obstruction: Secondary | ICD-10-CM | POA: Diagnosis not present

## 2018-03-25 DIAGNOSIS — K920 Hematemesis: Secondary | ICD-10-CM | POA: Diagnosis not present

## 2018-03-25 MED ORDER — DEXTROSE 5 % IV SOLN
3.0000 g | INTRAVENOUS | Status: AC
  Administered 2018-03-26: 3 g via INTRAVENOUS
  Filled 2018-03-25: qty 3

## 2018-03-25 NOTE — Addendum Note (Signed)
Addended by: Nicholes MangoBAILEY, MICHELE J on: 03/25/2018 11:14 AM   Modules accepted: Orders

## 2018-03-25 NOTE — Patient Instructions (Addendum)
We will refer you to see the Gastroenterologist. They will be calling you with an appointment date and time.  You have requested to have your gallbladder removed. This will be done on ________________ at [facility] with Dr. Everlene FarrierPabon.  You will most likely be out of work 1-2 weeks for this surgery. You will return after your post-op appointment with a lifting restriction for approximately 4 more weeks.  You will be able to eat anything you would like to following surgery. But, start by eating a bland diet and advance this as tolerated. The Gallbladder diet is below, please go as closely by this diet as possible prior to surgery to avoid any further attacks.  Please see the (blue)pre-care form that you have been given today. If you have any questions, please call our office.  Laparoscopic Cholecystectomy Laparoscopic cholecystectomy is surgery to remove the gallbladder. The gallbladder is located in the upper right part of the abdomen, behind the liver. It is a storage sac for bile, which is produced in the liver. Bile aids in the digestion and absorption of fats. Cholecystectomy is often done for inflammation of the gallbladder (cholecystitis). This condition is usually caused by a buildup of gallstones (cholelithiasis) in the gallbladder. Gallstones can block the flow of bile, and that can result in inflammation and pain. In severe cases, emergency surgery may be required. If emergency surgery is not required, you will have time to prepare for the procedure. Laparoscopic surgery is an alternative to open surgery. Laparoscopic surgery has a shorter recovery time. Your common bile duct may also need to be examined during the procedure. If stones are found in the common bile duct, they may be removed. LET [facility] KNOW ABOUT:  Any allergies you have.  All medicines you are taking, including vitamins, herbs, eye drops, creams, and over-the-counter medicines.  Previous problems you  or members of your family have had with the use of anesthetics.  Any blood disorders you have.  Previous surgeries you have had.    Any medical conditions you have. RISKS AND COMPLICATIONS Generally, this is a safe procedure. However, problems may occur, including:  Infection.  Bleeding.  Allergic reactions to medicines.  Damage to other structures or organs.  A stone remaining in the common bile duct.  A bile leak from the cyst duct that is clipped when your gallbladder is removed.  The need to convert to open surgery, which requires a larger incision in the abdomen. This may be necessary if your surgeon thinks that it is not safe to continue with a laparoscopic procedure. BEFORE THE PROCEDURE  Ask your health care provider about:  Changing or stopping your regular medicines. This is especially important if you are taking diabetes medicines or blood thinners.  Taking medicines such as aspirin and ibuprofen. These medicines can thin your blood. Do not take these medicines before your procedure if your health care provider instructs you not to.  Follow instructions from your health care provider about eating or drinking restrictions.  Let your health care provider know if you develop a cold or an infection before surgery.  Plan to have someone take you home after the procedure.  Ask your health care provider how your surgical site will be marked or identified.  You may be given antibiotic medicine to help prevent infection. PROCEDURE  To reduce your risk of infection:  Your health care team will wash or sanitize their hands.  Your skin will be washed with soap.  An IV tube may be inserted into one of your veins.  You will be given a medicine to make you fall asleep (general anesthetic).  A breathing tube will be placed in your mouth.  The surgeon will make several small cuts (incisions) in your abdomen.  A thin, lighted tube (laparoscope) that has a tiny  camera on the end will be inserted through one of the small incisions. The camera on the laparoscope will send a picture to a TV screen (monitor) in the operating room. This will give the surgeon a good view inside your abdomen.  A gas will be pumped into your abdomen. This will expand your abdomen to give the surgeon more room to perform the surgery.  Other tools that are needed for the procedure will be inserted through the other incisions. The gallbladder will be removed through one of the incisions.  After your gallbladder has been removed, the incisions will be closed with stitches (sutures), staples, or skin glue.  Your incisions may be covered with a bandage (dressing). The procedure may vary among health care providers and hospitals. AFTER THE PROCEDURE  Your blood pressure, heart rate, breathing rate, and blood oxygen level will be monitored often until the medicines you were given have worn off.  You will be given medicines as needed to control your pain.   This information is not intended to replace advice given to you by your health care provider. Make sure you discuss any questions you have with your health care provider.   Document Released: 06/24/2005 Document Revised: 03/15/2015 Document Reviewed: 02/03/2013 Elsevier Interactive Patient Education 2016 [place] Diet for Gallbladder Conditions A low-fat diet can be helpful if you have pancreatitis or a gallbladder condition. With these conditions, your pancreas and gallbladder have trouble digesting fats. A healthy eating plan with less fat will help rest your pancreas and gallbladder and reduce your symptoms. WHAT DO I NEED TO KNOW ABOUT THIS DIET?  Eat a low-fat diet.  Reduce your fat intake to less than 20-30% of your total daily calories. This is less than 50-60 g of fat per day.  Remember that you need some fat in your diet. Ask your dietician what your daily goal should be.  Choose nonfat and  low-fat healthy foods. Look for the words "nonfat," "low fat," or "fat free."  As a guide, look on the label and choose foods with less than 3 g of fat per serving. Eat only one serving.  Avoid alcohol.  Do not smoke. If you need help quitting, talk with your health care provider.  Eat small frequent meals instead of three large heavy meals. WHAT FOODS CAN I EAT? Grains Include healthy grains and starches such as potatoes, wheat bread, fiber-rich cereal, and brown rice. Choose whole grain options whenever possible. In adults, whole grains should account for 45-65% of your daily calories.  Fruits and Vegetables Eat plenty of fruits and vegetables. Fresh fruits and vegetables add fiber to your diet. Meats and Other Protein Sources Eat lean meat such as chicken and pork. Trim any fat off of meat before cooking it. Eggs, fish, and beans are other sources of protein. In adults, these foods should account for 10-35% of your daily calories. Dairy Choose low-fat milk and dairy options. Dairy includes fat and protein, as well as calcium.  Fats and Oils Limit high-fat foods such as fried foods, sweets, baked goods, sugary drinks.  Other Creamy sauces and condiments, such as mayonnaise, can add  extra fat. Think about whether or not you need to use them, or use smaller amounts or low fat options. WHAT FOODS ARE NOT RECOMMENDED?  High fat foods, such as:  [employer].  Ice cream.  [place] toast.  Sweet rolls.  Pizza.  Cheese bread.  Foods covered with batter, butter, creamy sauces, or cheese.  Fried foods.  Sugary drinks and desserts.  Foods that cause gas or bloating   This information is not intended to replace advice given to you by your health care provider. Make sure you discuss any questions you have with your health care provider.   Document Released: 06/29/2013 Document Reviewed: 06/29/2013 Elsevier Interactive Patient Education [employer]

## 2018-03-25 NOTE — Progress Notes (Signed)
Patient ID: Amy Brown, female   DOB: 04/07/1971, 46 y.o.   MRN: [id number]  HPI Amy Brown is a 46 y.o. female in consultation not after she went to the emergency room last week for abdominal pain nausea and vomiting.  Apparently she reports having some hematemesis that now has resolved.  She describes that she continues to have intermittent right upper quadrant epigastric pain that is sharp in nature and moderate in intensity.  He has associated, nausea.  Fevers no chills no evidence of biliary obstruction no evidence of cholangitis.  We will to perform more than 4 METS of activity without any shortness of breath or chest pain.  She does have a history of chronic pain and is managed with methadone.  In the emergency room and ultrasound was performed and I have personally reviewed showing evidence of cholelithiasis without cholecystitis normal common bile duct.  CBC and CMP are unremarkable.  Apparently she did have a history of a hiatal hernia in the past and was a scope years ago in a different hospital.  HPI  Past Medical History:  Diagnosis Date  . Chronic back pain   . Hypothyroidism   . Knee pain, left     Past Surgical History:  Procedure Laterality Date  . KNEE SURGERY Left   . NO PAST SURGERIES      Family History  Problem Relation Age of Onset  . Diabetes Unknown   . CAD Unknown   . Stroke Mother   . Learning disabilities Mother   . Lung cancer Mother   . Heart disease Father     Social History Social History   Tobacco Use  . Smoking status: Never Smoker  . Smokeless tobacco: Never Used  Substance Use Topics  . Alcohol use: No    Alcohol/week: 0.0 standard drinks  . Drug use: No    Allergies  Allergen Reactions  . Vancomycin Hives    Current Outpatient Medications  Medication Sig Dispense Refill  . cyclobenzaprine (FLEXERIL) 10 MG tablet Take 10 mg by mouth 3 (three) times daily.    . furosemide (LASIX) 40 MG tablet Take 40 mg by mouth 2 (two) times  daily.     . methadone (DOLOPHINE) 10 MG tablet Take 10 mg by mouth every 12 (twelve) hours.     . omeprazole (PRILOSEC) 20 MG capsule Take 20 mg by mouth 2 (two) times daily before a meal.    . sucralfate (CARAFATE) 1 g tablet Take 1 g by mouth 4 (four) times daily.     No current facility-administered medications for this visit.      Review of Systems Full ROS  was asked and was negative except for the information on the HPI  Physical Exam Blood pressure 140/90, pulse 88, temperature (!) 97.5 F (36.4 C), temperature source Skin, height 5' 9" (1.753 m), weight 272 lb (123.4 kg), last menstrual period 03/17/2018. CONSTITUTIONAL: Obese in NAD EYES: Pupils are equal, round, and reactive to light, Sclera are non-icteric. EARS, NOSE, MOUTH AND THROAT: The oropharynx is clear. The oral mucosa is pink and moist. Hearing is intact to voice. LYMPH NODES:  Lymph nodes in the neck are normal. RESPIRATORY:  Lungs are clear. There is normal respiratory effort, with equal breath sounds bilaterally, and without pathologic use of accessory muscles. CARDIOVASCULAR: Heart is regular without murmurs, gallops, or rubs. GI: The abdomen is soft, tenderness palpation right upper quadrant no peritonitis no Murphy. there are no palpable masses. There is no   hepatosplenomegaly. There are normal bowel sounds in all quadrants. GU: Rectal deferred.   MUSCULOSKELETAL: Normal muscle strength and tone. No cyanosis or edema.   SKIN: Turgor is good and there are no pathologic skin lesions or ulcers. NEUROLOGIC: Motor and sensation is grossly normal. Cranial nerves are grossly intact. PSYCH:  Oriented to person, place and time. Affect is normal.  Data Reviewed I have personally reviewed the patient's imaging, laboratory findings and medical records.    Assessment/Plan   6-year-old female with signs and symptoms consistent with biliary colic.  Discussed with the patient her disease process and I do recommend  cholecystectomy at some point time.  Also discussed with the patient about further GI work-up given the recent episode of hematemesis. She is to proceed for cholecystectomy first.  We will certainly make arrangement for outpatient follow-up with GI. I discussed the procedure in detail.  The patient was given educational material.  We discussed the risks and benefits of a laparoscopic cholecystectomy and possible cholangiogram including, but not limited to bleeding, infection, injury to surrounding structures such as the intestine or liver, bile leak, retained gallstones, need to convert to an open procedure, prolonged diarrhea, blood clots such as  DVT, common bile duct injury, anesthesia risks, and possible need for additional procedures.  The likelihood of improvement in symptoms and return to the patient's normal status is good. We discussed the typical post-operative recovery course. Also discussed in detail about postoperative pain management in this patient with chronic pain and current methadone use.  I verbally agree with her to give her only 1 prescription of Percocet after the procedure.  She understands and agreed with me.  Demeka Sutter, MD FACS General Surgeon 03/25/2018, 10:58 AM   

## 2018-03-25 NOTE — Progress Notes (Signed)
Patient's surgery has [facility] with Dr. Everlene FarrierPabon.   The patient has been instructed to be NPO after midnight. The patient will take her methadone and omeprazole the morning of surgery.   [facility] at 10:45 am per Dewayne HatchAnn in the Pre-admission Testing Department.   The patient is instructed to call the office if she has further questions.   Nicholes MangoMichele J. Bailey, CMA

## 2018-03-25 NOTE — H&P (View-Only) (Signed)
Patient ID: Amy Brown, female   DOB: 09-Dec-1970, 47 y.o.   MRN: [id number]  HPI Amy Brown is a 47 y.o. female in consultation not after she went to the emergency room last week for abdominal pain nausea and vomiting.  Apparently she reports having some hematemesis that now has resolved.  She describes that she continues to have intermittent right upper quadrant epigastric pain that is sharp in nature and moderate in intensity.  He has associated, nausea.  Fevers no chills no evidence of biliary obstruction no evidence of cholangitis.  We will to perform more than 4 METS of activity without any shortness of breath or chest pain.  She does have a history of chronic pain and is managed with methadone.  In the emergency room and ultrasound was performed and I have personally reviewed showing evidence of cholelithiasis without cholecystitis normal common bile duct.  CBC and CMP are unremarkable.  Apparently she did have a history of a hiatal hernia in the past and was a scope years ago in a different hospital.  HPI  Past Medical History:  Diagnosis Date  . Chronic back pain   . Hypothyroidism   . Knee pain, left     Past Surgical History:  Procedure Laterality Date  . KNEE SURGERY Left   . NO PAST SURGERIES      Family History  Problem Relation Age of Onset  . Diabetes Unknown   . CAD Unknown   . Stroke Mother   . Learning disabilities Mother   . Lung cancer Mother   . Heart disease Father     Social History Social History   Tobacco Use  . Smoking status: Never Smoker  . Smokeless tobacco: Never Used  Substance Use Topics  . Alcohol use: No    Alcohol/week: 0.0 standard drinks  . Drug use: No    Allergies  Allergen Reactions  . Vancomycin Hives    Current Outpatient Medications  Medication Sig Dispense Refill  . cyclobenzaprine (FLEXERIL) 10 MG tablet Take 10 mg by mouth 3 (three) times daily.    . furosemide (LASIX) 40 MG tablet Take 40 mg by mouth 2 (two) times  daily.     . methadone (DOLOPHINE) 10 MG tablet Take 10 mg by mouth every 12 (twelve) hours.     Marland Kitchen omeprazole (PRILOSEC) 20 MG capsule Take 20 mg by mouth 2 (two) times daily before a meal.    . sucralfate (CARAFATE) 1 g tablet Take 1 g by mouth 4 (four) times daily.     No current facility-administered medications for this visit.      Review of Systems Full ROS  was asked and was negative except for the information on the HPI  Physical Exam Blood pressure 140/90, pulse 88, temperature (!) 97.5 F (36.4 C), temperature source Skin, height 5\' 9"  (1.753 m), weight 272 lb (123.4 kg), last menstrual period 03/17/2018. CONSTITUTIONAL: Obese in NAD EYES: Pupils are equal, round, and reactive to light, Sclera are non-icteric. EARS, NOSE, MOUTH AND THROAT: The oropharynx is clear. The oral mucosa is pink and moist. Hearing is intact to voice. LYMPH NODES:  Lymph nodes in the neck are normal. RESPIRATORY:  Lungs are clear. There is normal respiratory effort, with equal breath sounds bilaterally, and without pathologic use of accessory muscles. CARDIOVASCULAR: Heart is regular without murmurs, gallops, or rubs. GI: The abdomen is soft, tenderness palpation right upper quadrant no peritonitis no Murphy. there are no palpable masses. There is no  hepatosplenomegaly. There are normal bowel sounds in all quadrants. GU: Rectal deferred.   MUSCULOSKELETAL: Normal muscle strength and tone. No cyanosis or edema.   SKIN: Turgor is good and there are no pathologic skin lesions or ulcers. NEUROLOGIC: Motor and sensation is grossly normal. Cranial nerves are grossly intact. PSYCH:  Oriented to person, place and time. Affect is normal.  Data Reviewed I have personally reviewed the patient's imaging, laboratory findings and medical records.    Assessment/Plan   47-year-old female with signs and symptoms consistent with biliary colic.  Discussed with the patient her disease process and I do recommend  cholecystectomy at some point time.  Also discussed with the patient about further GI work-up given the recent episode of hematemesis. She is to proceed for cholecystectomy first.  We will certainly make arrangement for outpatient follow-up with GI. I discussed the procedure in detail.  The patient was given [employer] engineered[employer] material.  We discussed the risks and benefits of a laparoscopic cholecystectomy and possible cholangiogram including, but not limited to bleeding, infection, injury to surrounding structures such as the intestine or liver, bile leak, retained gallstones, need to convert to an open procedure, prolonged diarrhea, blood clots such as  DVT, common bile duct injury, anesthesia risks, and possible need for additional procedures.  The likelihood of improvement in symptoms and return to the patient's normal status is good. We discussed the typical post-operative recovery course. Also discussed in detail about postoperative pain management in this patient with chronic pain and current methadone use.  I verbally agree with her to give her only 1 prescription of Percocet after the procedure.  She understands and agreed with me.  Sterling Bigiego Braniyah Besse, MD FACS General Surgeon 03/25/2018, 10:58 AM

## 2018-03-26 ENCOUNTER — Encounter
Admission: RE | Disposition: A | Discharge status: Home / Self Care | Payer: Self-pay | Source: Ambulatory Visit | Attending: Surgery

## 2018-03-26 ENCOUNTER — Ambulatory Visit: Payer: Medicare Other | Admitting: Anesthesiology

## 2018-03-26 ENCOUNTER — Encounter: Payer: Self-pay | Admitting: *Deleted

## 2018-03-26 ENCOUNTER — Ambulatory Visit
Admission: RE | Admit: 2018-03-26 | Discharge: 2018-03-26 | Disposition: A | Discharge status: Home / Self Care | Payer: Medicare Other | Source: Ambulatory Visit | Attending: Surgery | Admitting: Surgery

## 2018-03-26 ENCOUNTER — Other Ambulatory Visit: Payer: Self-pay

## 2018-03-26 DIAGNOSIS — Z79899 Other long term (current) drug therapy: Secondary | ICD-10-CM | POA: Insufficient documentation

## 2018-03-26 DIAGNOSIS — K8064 Calculus of gallbladder and bile duct with chronic cholecystitis without obstruction: Secondary | ICD-10-CM | POA: Diagnosis present

## 2018-03-26 DIAGNOSIS — K828 Other specified diseases of gallbladder: Secondary | ICD-10-CM | POA: Diagnosis not present

## 2018-03-26 DIAGNOSIS — K920 Hematemesis: Secondary | ICD-10-CM | POA: Diagnosis not present

## 2018-03-26 DIAGNOSIS — K8010 Calculus of gallbladder with chronic cholecystitis without obstruction: Secondary | ICD-10-CM | POA: Insufficient documentation

## 2018-03-26 DIAGNOSIS — M549 Dorsalgia, unspecified: Secondary | ICD-10-CM | POA: Insufficient documentation

## 2018-03-26 DIAGNOSIS — Z823 Family history of stroke: Secondary | ICD-10-CM | POA: Diagnosis not present

## 2018-03-26 DIAGNOSIS — Z833 Family history of diabetes mellitus: Secondary | ICD-10-CM | POA: Insufficient documentation

## 2018-03-26 DIAGNOSIS — E039 Hypothyroidism, unspecified: Secondary | ICD-10-CM | POA: Diagnosis not present

## 2018-03-26 DIAGNOSIS — Z8249 Family history of ischemic heart disease and other diseases of the circulatory system: Secondary | ICD-10-CM | POA: Insufficient documentation

## 2018-03-26 DIAGNOSIS — Z810 Family history of intellectual disabilities: Secondary | ICD-10-CM | POA: Insufficient documentation

## 2018-03-26 DIAGNOSIS — Z801 Family history of malignant neoplasm of trachea, bronchus and lung: Secondary | ICD-10-CM | POA: Insufficient documentation

## 2018-03-26 DIAGNOSIS — K8020 Calculus of gallbladder without cholecystitis without obstruction: Secondary | ICD-10-CM

## 2018-03-26 DIAGNOSIS — G8929 Other chronic pain: Secondary | ICD-10-CM | POA: Diagnosis not present

## 2018-03-26 DIAGNOSIS — Z881 Allergy status to other antibiotic agents status: Secondary | ICD-10-CM | POA: Insufficient documentation

## 2018-03-26 DIAGNOSIS — K449 Diaphragmatic hernia without obstruction or gangrene: Secondary | ICD-10-CM | POA: Diagnosis not present

## 2018-03-26 HISTORY — PX: CHOLECYSTECTOMY: SHX55

## 2018-03-26 LAB — POCT PREGNANCY, URINE: PREG TEST UR: NEGATIVE

## 2018-03-26 SURGERY — LAPAROSCOPIC CHOLECYSTECTOMY
Anesthesia: General | Site: Abdomen

## 2018-03-26 MED ORDER — ONDANSETRON HCL 4 MG/2ML IJ SOLN
INTRAMUSCULAR | Status: AC
  Filled 2018-03-26: qty 2

## 2018-03-26 MED ORDER — ONDANSETRON HCL 4 MG/2ML IJ SOLN
4.0000 mg | Freq: Once | INTRAMUSCULAR | Status: AC | PRN
  Administered 2018-03-26: 4 mg via INTRAVENOUS

## 2018-03-26 MED ORDER — CHLORHEXIDINE GLUCONATE CLOTH 2 % EX PADS: 6.0000 | MEDICATED_PAD | Freq: Once | CUTANEOUS | Status: DC

## 2018-03-26 MED ORDER — PROPOFOL 10 MG/ML IV BOLUS
INTRAVENOUS | Status: DC | PRN
  Administered 2018-03-26: 170 mg via INTRAVENOUS

## 2018-03-26 MED ORDER — ACETAMINOPHEN 10 MG/ML IV SOLN
INTRAVENOUS | Status: AC
  Filled 2018-03-26: qty 100

## 2018-03-26 MED ORDER — SEVOFLURANE IN SOLN
RESPIRATORY_TRACT | Status: AC
  Filled 2018-03-26: qty 250

## 2018-03-26 MED ORDER — LACTATED RINGERS IV SOLN
INTRAVENOUS | Status: DC
  Administered 2018-03-26: 12:00:00 via INTRAVENOUS

## 2018-03-26 MED ORDER — HYDROMORPHONE HCL 1 MG/ML IJ SOLN
0.2500 mg | INTRAMUSCULAR | Status: DC | PRN
  Administered 2018-03-26 (×4): 0.25 mg via INTRAVENOUS

## 2018-03-26 MED ORDER — SUGAMMADEX SODIUM 200 MG/2ML IV SOLN
INTRAVENOUS | Status: DC | PRN
  Administered 2018-03-26: 400 mg via INTRAVENOUS

## 2018-03-26 MED ORDER — PROMETHAZINE HCL 25 MG/ML IJ SOLN
6.2500 mg | Freq: Once | INTRAMUSCULAR | Status: AC
  Administered 2018-03-26: 6.25 mg via INTRAVENOUS

## 2018-03-26 MED ORDER — FENTANYL CITRATE (PF) 100 MCG/2ML IJ SOLN
INTRAMUSCULAR | Status: DC | PRN
  Administered 2018-03-26: 50 ug via INTRAVENOUS
  Administered 2018-03-26: 100 ug via INTRAVENOUS

## 2018-03-26 MED ORDER — SODIUM CHLORIDE FLUSH 0.9 % IV SOLN
INTRAVENOUS | Status: AC
  Filled 2018-03-26: qty 10

## 2018-03-26 MED ORDER — BUPIVACAINE-EPINEPHRINE 0.25% -1:200000 IJ SOLN
INTRAMUSCULAR | Status: DC | PRN
  Administered 2018-03-26: 30 mL

## 2018-03-26 MED ORDER — DEXAMETHASONE SODIUM PHOSPHATE 10 MG/ML IJ SOLN
INTRAMUSCULAR | Status: DC | PRN
  Administered 2018-03-26: 10 mg via INTRAVENOUS

## 2018-03-26 MED ORDER — OXYCODONE-ACETAMINOPHEN 5-325 MG PO TABS
ORAL_TABLET | ORAL | Status: AC
  Administered 2018-03-26: 1 via ORAL
  Filled 2018-03-26: qty 1

## 2018-03-26 MED ORDER — LIDOCAINE HCL (CARDIAC) PF 100 MG/5ML IV SOSY
PREFILLED_SYRINGE | INTRAVENOUS | Status: DC | PRN
  Administered 2018-03-26: 100 mg via INTRAVENOUS

## 2018-03-26 MED ORDER — FENTANYL CITRATE (PF) 100 MCG/2ML IJ SOLN
25.0000 ug | INTRAMUSCULAR | Status: AC | PRN
  Administered 2018-03-26 (×6): 25 ug via INTRAVENOUS

## 2018-03-26 MED ORDER — KETOROLAC TROMETHAMINE 30 MG/ML IJ SOLN
INTRAMUSCULAR | Status: DC | PRN
  Administered 2018-03-26: 30 mg via INTRAVENOUS

## 2018-03-26 MED ORDER — PROMETHAZINE HCL 25 MG/ML IJ SOLN
INTRAMUSCULAR | Status: AC
  Administered 2018-03-26: 6.25 mg via INTRAVENOUS
  Filled 2018-03-26: qty 1

## 2018-03-26 MED ORDER — KETOROLAC TROMETHAMINE 30 MG/ML IJ SOLN
INTRAMUSCULAR | Status: AC
  Filled 2018-03-26: qty 1

## 2018-03-26 MED ORDER — OXYCODONE-ACETAMINOPHEN 5-325 MG PO TABS: 1.0000 | ORAL_TABLET | ORAL | 0 refills | Status: DC | PRN

## 2018-03-26 MED ORDER — FENTANYL CITRATE (PF) 100 MCG/2ML IJ SOLN
INTRAMUSCULAR | Status: AC
  Administered 2018-03-26: 25 ug via INTRAVENOUS
  Filled 2018-03-26: qty 2

## 2018-03-26 MED ORDER — HYDROMORPHONE HCL 1 MG/ML IJ SOLN
INTRAMUSCULAR | Status: AC
  Administered 2018-03-26: 0.25 mg via INTRAVENOUS
  Filled 2018-03-26: qty 1

## 2018-03-26 MED ORDER — ROCURONIUM BROMIDE 100 MG/10ML IV SOLN
INTRAVENOUS | Status: DC | PRN
  Administered 2018-03-26: 50 mg via INTRAVENOUS

## 2018-03-26 MED ORDER — OXYCODONE-ACETAMINOPHEN 5-325 MG PO TABS
1.0000 | ORAL_TABLET | ORAL | Status: DC | PRN
  Administered 2018-03-26: 1 via ORAL

## 2018-03-26 MED ORDER — MIDAZOLAM HCL 2 MG/2ML IJ SOLN
INTRAMUSCULAR | Status: DC | PRN
  Administered 2018-03-26: 2 mg via INTRAVENOUS

## 2018-03-26 MED ORDER — ONDANSETRON HCL 4 MG/2ML IJ SOLN
INTRAMUSCULAR | Status: DC | PRN
  Administered 2018-03-26: 4 mg via INTRAVENOUS

## 2018-03-26 MED ORDER — DEXAMETHASONE SODIUM PHOSPHATE 10 MG/ML IJ SOLN
INTRAMUSCULAR | Status: AC
  Filled 2018-03-26: qty 1

## 2018-03-26 MED ORDER — SUGAMMADEX SODIUM 200 MG/2ML IV SOLN
INTRAVENOUS | Status: AC
  Filled 2018-03-26: qty 4

## 2018-03-26 MED ORDER — ACETAMINOPHEN 10 MG/ML IV SOLN
INTRAVENOUS | Status: DC | PRN
  Administered 2018-03-26: 1000 mg via INTRAVENOUS

## 2018-03-26 SURGICAL SUPPLY — 50 items
ADH SKN CLS APL DERMABOND .7 (GAUZE/BANDAGES/DRESSINGS) ×2
APL SWBSTK 6 STRL LF DISP (MISCELLANEOUS)
APPLICATOR COTTON TIP 6 STRL (MISCELLANEOUS) ×1 IMPLANT
APPLICATOR COTTON TIP 6IN STRL (MISCELLANEOUS)
APPLIER CLIP 5 13 M/L LIGAMAX5 (MISCELLANEOUS) ×3
APR CLP MED LRG 5 ANG JAW (MISCELLANEOUS) ×1
BAG SPEC RTRVL LRG 6X4 10 (ENDOMECHANICALS) ×1
BLADE SURG 15 STRL LF DISP TIS (BLADE) ×1 IMPLANT
BLADE SURG 15 STRL SS (BLADE) ×3
CANISTER SUCT 1200ML W/VALVE (MISCELLANEOUS) ×3 IMPLANT
CHLORAPREP W/TINT 26ML (MISCELLANEOUS) ×3 IMPLANT
CHOLANGIOGRAM CATH TAUT (CATHETERS) IMPLANT
CLEANER CAUTERY TIP 5X5 PAD (MISCELLANEOUS) ×1 IMPLANT
CLIP APPLIE 5 13 M/L LIGAMAX5 (MISCELLANEOUS) ×1 IMPLANT
DECANTER SPIKE VIAL GLASS SM (MISCELLANEOUS) ×6 IMPLANT
DERMABOND ADVANCED (GAUZE/BANDAGES/DRESSINGS) ×4
DERMABOND ADVANCED .7 DNX12 (GAUZE/BANDAGES/DRESSINGS) ×1 IMPLANT
DRAPE C-ARM XRAY 36X54 (DRAPES) ×1 IMPLANT
ELECT CAUTERY BLADE 6.4 (BLADE) ×3 IMPLANT
ELECT REM PT RETURN 9FT ADLT (ELECTROSURGICAL) ×3
ELECTRODE REM PT RTRN 9FT ADLT (ELECTROSURGICAL) ×1 IMPLANT
GLOVE BIO SURGEON STRL SZ7 (GLOVE) ×3 IMPLANT
GOWN STRL REUS W/ TWL LRG LVL3 (GOWN DISPOSABLE) ×3 IMPLANT
GOWN STRL REUS W/TWL LRG LVL3 (GOWN DISPOSABLE) ×9
IRRIGATION STRYKERFLOW (MISCELLANEOUS) ×1 IMPLANT
IRRIGATOR STRYKERFLOW (MISCELLANEOUS) ×3
IV CATH ANGIO 12GX3 LT BLUE (NEEDLE) ×3 IMPLANT
IV NS 1000ML (IV SOLUTION) ×3
IV NS 1000ML BAXH (IV SOLUTION) ×1 IMPLANT
L-HOOK LAP DISP 36CM (ELECTROSURGICAL) ×3
LHOOK LAP DISP 36CM (ELECTROSURGICAL) ×1 IMPLANT
NEEDLE HYPO 22GX1.5 SAFETY (NEEDLE) ×6 IMPLANT
NS IRRIG 1000ML POUR BTL (IV SOLUTION) ×3 IMPLANT
PACK LAP CHOLECYSTECTOMY (MISCELLANEOUS) ×3 IMPLANT
PAD CLEANER CAUTERY TIP 5X5 (MISCELLANEOUS) ×2
PENCIL ELECTRO HAND CTR (MISCELLANEOUS) ×3 IMPLANT
POUCH SPECIMEN RETRIEVAL 10MM (ENDOMECHANICALS) ×3 IMPLANT
SCISSORS METZENBAUM CVD 33 (INSTRUMENTS) ×3 IMPLANT
SLEEVE ENDOPATH XCEL 5M (ENDOMECHANICALS) ×6 IMPLANT
SOL ANTI-FOG 6CC FOG-OUT (MISCELLANEOUS) ×1 IMPLANT
SOL FOG-OUT ANTI-FOG 6CC (MISCELLANEOUS) ×2
SPONGE LAP 18X18 RF (DISPOSABLE) ×3 IMPLANT
STOPCOCK 4 WAY LG BORE MALE ST (IV SETS) IMPLANT
SUT ETHIBOND 0 MO6 C/R (SUTURE) IMPLANT
SUT MNCRL AB 4-0 PS2 18 (SUTURE) ×3 IMPLANT
SUT VICRYL 0 AB UR-6 (SUTURE) ×6 IMPLANT
SYR 20CC LL (SYRINGE) ×3 IMPLANT
TROCAR XCEL BLUNT TIP 100MML (ENDOMECHANICALS) ×3 IMPLANT
TROCAR XCEL NON-BLD 5MMX100MML (ENDOMECHANICALS) ×3 IMPLANT
TUBING INSUFFLATION (TUBING) ×3 IMPLANT

## 2018-03-26 NOTE — Discharge Instructions (Addendum)
Laparoscopic Cholecystectomy, Care After  ° °These instructions give you information on caring for yourself after your procedure. Your doctor may also give you more specific instructions. Call your doctor if you have any problems or questions after your procedure.  °HOME CARE  °Change your bandages (dressings) as told by your doctor.  °Keep the wound dry and clean. Wash the wound gently with soap and water. Pat the wound dry with a clean towel.  °Do not take baths, swim, or use hot tubs for 2 weeks, or as told by your doctor.  °Only take medicine as told by your doctor.  °Eat a normal diet as told by your doctor.  °Do not lift anything heavier than 10 pounds (4.5 kg) until your doctor says it is okay.  °Do not play contact sports for 1 week, or as told by your doctor. °GET HELP IF:  °Your wound is red, puffy (swollen), or painful.  °You have yellowish-white fluid (pus) coming from the wound.  °You have fluid draining from the wound for more than 1 day.  °You have a bad smell coming from the wound.  °Your wound breaks open. °GET HELP RIGHT AWAY IF:  °You have trouble breathing.  °You have chest pain.  °You have a fever >101  °You have pain in the shoulders (shoulder strap areas) that is getting worse.  °You feel dizzy or pass out (faint).  °You have severe belly (abdominal) pain.  °You feel sick to your stomach (nauseous) or throw up (vomit) for more than 1 day. ° ° ° °AMBULATORY SURGERY  °DISCHARGE INSTRUCTIONS ° ° °1) The drugs that you were given will stay in your system until tomorrow so for the next 24 hours you should not: ° °A) Drive an automobile °B) Make any legal decisions °C) Drink any alcoholic beverage ° ° °2) You may resume regular meals tomorrow.  Today it is better to start with liquids and gradually work up to solid foods. ° °You may eat anything you prefer, but it is better to start with liquids, then soup and crackers, and gradually work up to solid foods. ° ° °3) Please notify your doctor  immediately if you have any unusual bleeding, trouble breathing, redness and pain at the surgery site, drainage, fever, or pain not relieved by medication. ° ° ° °4) Additional Instructions: ° ° ° ° ° ° ° °Please contact your physician with any problems or Same Day Surgery at [phone], Monday through Friday 6 am to 4 pm, or [place] [facility] [place] [facility] number at [phone]. ° ° °

## 2018-03-26 NOTE — Op Note (Signed)
Laparoscopic Cholecystectomy  Pre-operative Diagnosis: Biliary colic  Post-operative Diagnosis: Acute cholecystitis  Surgeon: Sterling Bigiego Pabon, MD FACS  Anesthesia: Gen. with endotracheal tube  Findings: Acute Cholecystitis w hydrops Fatty liver  Estimated Blood Loss: 10cc                 Specimens: Gallbladder           Complications: none   Procedure Details  The patient was seen again in the Holding Room. The benefits, complications, treatment options, and expected outcomes were discussed with the patient. The risks of bleeding, infection, recurrence of symptoms, failure to resolve symptoms, bile duct damage, bile duct leak, retained common bile duct stone, bowel injury, any of which could require further surgery and/or ERCP, stent, or papillotomy were reviewed with the patient. The likelihood of improving the patient's symptoms with return to their baseline status is good.  The patient and/or family concurred with the proposed plan, giving informed consent.  The patient was taken to Operating Room, identified as Joycelyn RuaGina M Schendel and the procedure verified as Laparoscopic Cholecystectomy.  A Time Out was held and the above information confirmed.  Prior to the induction of general anesthesia, antibiotic prophylaxis was administered. VTE prophylaxis was in place. General endotracheal anesthesia was then administered and tolerated well. After the induction, the abdomen was prepped with Chloraprep and draped in the sterile fashion. The patient was positioned in the supine position.  Cut down technique was used to enter the abdominal cavity and a Hasson trochar was placed after two vicryl stitches were anchored to the fascia. Pneumoperitoneum was then created with CO2 and tolerated well without any adverse changes in the patient's vital signs.  Three 5-mm ports were placed in the right upper quadrant all under direct vision. All skin incisions  were infiltrated with a local anesthetic agent before  making the incision and placing the trocars.   The patient was positioned  in reverse Trendelenburg, tilted slightly to the patient's left.  The gallbladder was identified, the fundus grasped and retracted cephalad. Adhesions were lysed bluntly. The infundibulum was grasped and retracted laterally, exposing the peritoneum overlying the triangle of Calot. This was then divided and exposed in a blunt fashion. An extended critical view of the cystic duct and cystic artery was obtained.  The cystic duct was clearly identified and bluntly dissected.   Artery and duct were double clipped and divided.  The gallbladder was taken from the gallbladder fossa in a retrograde fashion with the electrocautery. The gallbladder was removed and placed in an Endocatch bag. The liver bed was irrigated and inspected. Hemostasis was achieved with the electrocautery. Copious irrigation was utilized and was repeatedly aspirated until clear.  The gallbladder and Endocatch sac were then removed through a port site.    Inspection of the right upper quadrant was performed. No bleeding, bile duct injury or leak, or bowel injury was noted. Pneumoperitoneum was released.  The periumbilical port site was closed with interrumpted 0 Vicryl sutures. 4-0 subcuticular Monocryl was used to close the skin. Dermabond was  applied.  The patient was then extubated and brought to the recovery room in stable condition. Sponge, lap, and needle counts were correct at closure and at the conclusion of the case.               Sterling Bigiego Pabon, MD, FACS

## 2018-03-26 NOTE — Anesthesia Procedure Notes (Signed)
Procedure Name: Intubation Date/Time: 03/26/2018 3:04 PM Performed by: Doreen Salvage, CRNA Pre-anesthesia Checklist: Patient identified, Patient being monitored, Timeout performed, Emergency Drugs available and Suction available Patient Re-evaluated:Patient Re-evaluated prior to induction Oxygen Delivery Method: Circle system utilized Preoxygenation: Pre-oxygenation with 100% oxygen Induction Type: IV induction Ventilation: Mask ventilation without difficulty Laryngoscope Size: Mac, 3 and McGraph Grade View: Grade I Tube type: Oral Tube size: 7.0 mm Number of attempts: 1 Airway Equipment and Method: Stylet Placement Confirmation: ETT inserted through vocal cords under direct vision,  positive ETCO2 and breath sounds checked- equal and bilateral Secured at: 21 cm Tube secured with: Tape Dental Injury: Teeth and Oropharynx as per pre-operative assessment

## 2018-03-26 NOTE — Interval H&P Note (Signed)
History and Physical Interval Note:  03/26/2018 12:21 PM  Amy RuaGina M Kryder  has presented today for surgery, with the diagnosis of N/A  The various methods of treatment have been discussed with the patient and family. After consideration of risks, benefits and other options for treatment, the patient has consented to  Procedure(s): LAPAROSCOPIC CHOLECYSTECTOMY (N/A) as a surgical intervention .  The patient's history has been reviewed, patient examined, no change in status, stable for surgery.  I have reviewed the patient's chart and labs.  Questions were answered to the patient's satisfaction.     Geraldyn Shain F Yazmine Sorey

## 2018-03-26 NOTE — Transfer of Care (Signed)
Immediate Anesthesia Transfer of Care Note  Patient: Amy Brown  Procedure(s) Performed: Procedure(s): LAPAROSCOPIC CHOLECYSTECTOMY (N/A)  Patient Location: PACU  Anesthesia Type:General  Level of Consciousness: sedated  Airway & Oxygen Therapy: Patient Spontanous Breathing and Patient connected to face mask oxygen  Post-op Assessment: Report given to RN and Post -op Vital signs reviewed and stable  Post vital signs: Reviewed and stable  Last Vitals:  Vitals:   03/26/18 1150 03/26/18 1639  BP: (!) 144/90 (!) 172/103  Pulse: 92 (!) 125  Resp: 18 (!) 21  Temp: 36.9 C 37.1 C  SpO2: 97% 98%    Complications: No apparent anesthesia complications

## 2018-03-26 NOTE — Anesthesia Preprocedure Evaluation (Signed)
Anesthesia Evaluation  Patient identified by MRN, date of birth, ID band Patient awake    Reviewed: Allergy & Precautions, H&P , NPO status , Patient's Chart, lab work & pertinent test results, reviewed documented beta blocker date and time   Airway Mallampati: II  TM Distance: >3 FB Neck ROM: full    Dental  (+) Teeth Intact   Pulmonary neg pulmonary ROS,    Pulmonary exam normal        Cardiovascular negative cardio ROS Normal cardiovascular exam Rhythm:regular Rate:Normal     Neuro/Psych negative neurological ROS  negative psych ROS   GI/Hepatic negative GI ROS, Neg liver ROS,   Endo/Other  negative endocrine ROSHypothyroidism   Renal/GU negative Renal ROS  negative genitourinary   Musculoskeletal   Abdominal   Peds  Hematology negative hematology ROS (+)   Anesthesia Other Findings Past Medical History: No date: Chronic back pain No date: Hypothyroidism No date: Knee pain, left Past Surgical History: No date: KNEE SURGERY; Left No date: NO PAST SURGERIES   Reproductive/Obstetrics negative OB ROS                             Anesthesia Physical Anesthesia Plan  ASA: III  Anesthesia Plan: General ETT   Post-op Pain Management:    Induction:   PONV Risk Score and Plan:   Airway Management Planned:   Additional Equipment:   Intra-op Plan:   Post-operative Plan:   Informed Consent: I have reviewed the patients History and Physical, chart, labs and discussed the procedure including the risks, benefits and alternatives for the proposed anesthesia with the patient or authorized representative who has indicated his/her understanding and acceptance.   Dental Advisory Given  Plan Discussed with: CRNA  Anesthesia Plan Comments:         Anesthesia Quick Evaluation

## 2018-03-26 NOTE — OR Nursing (Signed)
Patient reports she takes methadone for chronic back pain. She denies history of drug abuse. Dr. Pernell DupreAdams notified and he reports no drug screen is needed.

## 2018-03-26 NOTE — Anesthesia Post-op Follow-up Note (Signed)
Anesthesia QCDR form completed.        

## 2018-03-27 ENCOUNTER — Encounter: Payer: Self-pay | Admitting: Surgery

## 2018-03-27 NOTE — Anesthesia Postprocedure Evaluation (Signed)
Anesthesia Post Note  Patient: Amy Brown  Procedure(s) Performed: LAPAROSCOPIC CHOLECYSTECTOMY (N/A Abdomen)  Patient location during evaluation: PACU Anesthesia Type: General Level of consciousness: awake and alert and oriented Pain management: pain level controlled Vital Signs Assessment: post-procedure vital signs reviewed and stable Respiratory status: spontaneous breathing Cardiovascular status: blood pressure returned to baseline Anesthetic complications: no     Last Vitals:  Vitals:   03/26/18 1818 03/26/18 1828  BP: 111/74 115/82  Pulse: (!) 104 (!) 109  Resp: 16   Temp: 36.7 C   SpO2: 98% 98%    Last Pain:  Vitals:   03/26/18 1818  TempSrc:   PainSc: 5                  Othell Diluzio

## 2018-03-30 LAB — SURGICAL PATHOLOGY

## 2018-04-01 ENCOUNTER — Telehealth: Payer: Self-pay | Admitting: Surgery

## 2018-04-01 NOTE — Telephone Encounter (Signed)
Nurse appt for 04-02-18 at 1:30

## 2018-04-01 NOTE — Telephone Encounter (Signed)
Patients calling said her incision site near the belly button was bleeding last night, and also complains on her side it feels like a hard spot and is having some pain, pain level being a nine, patient said she could be reached at [phone]. Please call patient and advise.

## 2018-04-02 ENCOUNTER — Encounter: Payer: Self-pay | Admitting: Surgery

## 2018-04-02 ENCOUNTER — Ambulatory Visit (INDEPENDENT_AMBULATORY_CARE_PROVIDER_SITE_OTHER): Payer: Medicare Other | Admitting: Surgery

## 2018-04-02 VITALS — BP 162/90 | HR 108 | Temp 97.7°F | Resp 16 | Ht 69.0 in | Wt 268.0 lb

## 2018-04-02 DIAGNOSIS — Z4889 Encounter for other specified surgical aftercare: Secondary | ICD-10-CM

## 2018-04-02 NOTE — Progress Notes (Signed)
Surgical Clinic Progress/Follow-up Note   HPI:  47 y.o. Female presents to clinic today (her birthday) for early post-op follow-up 1 week s/p laparoscopic cholecystectomy with pre-operative diagnosis of symptomatic cholelithiasis and post-operative diagnosis of chronic cholecystitis with gallbladder hydrops (Pabon, 03/26/2018). Patient reports she's experienced frequent N/V x several months, including blood with emesis on a few occasions, for which she's been referred to GI. Over the first 3 days following surgery, patient says walking helped improve B/L upper quadrant abdominal pain, but her after vomiting again Saturday (5 days ago, POD #2), her RUQ > LUQ > peri-umbilical pain has been constant, particularly with standing and unrelated to eating. She describes her pre-operative constipation has essentially resolved with more routine soft non-loose daily BM's. She adds that her temperature has reached 100.3. She otherwise denies chills, CP, or SOB.  Review of Systems:  Constitutional: fever/chills as per interval history  Respiratory: denies shortness of breath, wheezing  Cardiovascular: denies chest pain, palpitations  Gastrointestinal: abdominal pain, N/V, and bowel function as per interval history Skin: Denies any other rashes or skin discolorations except post-surgical wounds as per interval history  Vital Signs:  BP (!) 162/90   Pulse (!) 108   Temp 97.7 F (36.5 C) (Skin)   Resp 16   Ht 5\' 9"  (1.753 m)   Wt 268 lb (121.6 kg)   LMP 03/17/2018 (Exact Date)   SpO2 98%   BMI 39.58 kg/m    Physical Exam:  Constitutional:  -- Obese body habitus  -- Awake, alert, and oriented x3  Pulmonary:  -- No crackles -- Equal breath sounds bilaterally -- Breathing non-labored at rest Cardiovascular:  -- S1, S2 present  -- No pericardial rubs  Gastrointestinal:  -- Soft and obese, but non-distended, mild-/moderate- peri-umbilical peri-incisional tenderness to palpation, no  guarding/rebound tenderness -- Post-surgical incisions all well-approximated without any peri-incisional erythema or drainage -- No abdominal masses appreciated, pulsatile or otherwise  Musculoskeletal / Integumentary:  -- Wounds or skin discoloration: None appreciated except post-surgical incisions as described above (GI) -- Extremities: B/L UE and LE FROM, hands and feet warm   Imaging: No new pertinent imaging available for review   Assessment:  47 y.o. yo Female with a problem list including...  Patient Active Problem List   Diagnosis Date Noted  . Calculus of gallbladder without cholecystitis without obstruction   . Chronic back pain 03/13/2015  . Hypothyroidism 03/13/2015  . Hypokalemia 03/13/2015  . Abdominal wall cellulitis 03/13/2015  . Bilateral lower extremity edema 03/13/2015    presents to clinic for early post-op follow-up evaluation of peri-incisional abdominal pain and persistent chronic N/V 1 week s/p laparoscopic cholecystectomy (Pabon, 03/26/2018) for symptomatic cholelithiasis with intra-operative finding of chronic cholecystitis with gallbladder hydrops.  Plan:              - advance diet as tolerated  - keep GI appointment for persistent N/V  - discussed with patient typical post-surgical recovery  - offered RUQ ultrasound and labs vs monitor until next week  - patient states she'd like to see how her symptoms change over weekend and call office next week if hasn't improved             - no heavy lifting >15-20 lbs until seen next week and otherwise as advised by Dr. Everlene Farrier             - apply sunblock particularly to incisions with sun exposure to reduce pigmentation of scars             -  okay to shower, but do not submerge incisions under water until seen next week             - return to clinic as scheduled with Dr. Everlene Farrier next week  - instructed to call office if any questions or concerns  All of the above recommendations were discussed with the patient, and  all of patient's questions were answered to her expressed satisfaction.  -- Scherrie Gerlach Earlene Plater, MD, RPVI [place]: [facility] General Surgery - Partnering for exceptional care. Office: [phone]

## 2018-04-02 NOTE — Patient Instructions (Addendum)
The patient is aware to call back for any questions or new concerns. May use heat or ice to the area for comfort

## 2018-04-06 ENCOUNTER — Encounter: Payer: Self-pay | Admitting: *Deleted

## 2018-04-08 ENCOUNTER — Encounter: Payer: Medicare Other | Admitting: Surgery

## 2018-04-14 ENCOUNTER — Encounter: Payer: Self-pay | Admitting: *Deleted

## 2018-04-14 ENCOUNTER — Emergency Department: Payer: Medicare Other

## 2018-04-14 ENCOUNTER — Other Ambulatory Visit: Payer: Self-pay

## 2018-04-14 ENCOUNTER — Emergency Department
Admission: EM | Admit: 2018-04-14 | Discharge: 2018-04-14 | Disposition: A | Discharge status: Home / Self Care | Payer: Medicare Other | Attending: Emergency Medicine | Admitting: Emergency Medicine

## 2018-04-14 DIAGNOSIS — Z79899 Other long term (current) drug therapy: Secondary | ICD-10-CM | POA: Insufficient documentation

## 2018-04-14 DIAGNOSIS — I10 Essential (primary) hypertension: Secondary | ICD-10-CM | POA: Diagnosis not present

## 2018-04-14 DIAGNOSIS — R1010 Upper abdominal pain, unspecified: Secondary | ICD-10-CM | POA: Insufficient documentation

## 2018-04-14 DIAGNOSIS — R112 Nausea with vomiting, unspecified: Secondary | ICD-10-CM | POA: Insufficient documentation

## 2018-04-14 DIAGNOSIS — J45909 Unspecified asthma, uncomplicated: Secondary | ICD-10-CM | POA: Diagnosis not present

## 2018-04-14 DIAGNOSIS — R1110 Vomiting, unspecified: Secondary | ICD-10-CM | POA: Diagnosis present

## 2018-04-14 HISTORY — DX: Unspecified asthma, uncomplicated: J45.909

## 2018-04-14 HISTORY — DX: Essential (primary) hypertension: I10

## 2018-04-14 LAB — COMPREHENSIVE METABOLIC PANEL
ALBUMIN: 3.6 g/dL (ref 3.5–5.0)
ALT: 21 U/L (ref 0–44)
AST: 27 U/L (ref 15–41)
Alkaline Phosphatase: 98 U/L (ref 38–126)
Anion gap: 8 (ref 5–15)
BILIRUBIN TOTAL: 0.4 mg/dL (ref 0.3–1.2)
BUN: 9 mg/dL (ref 6–20)
CHLORIDE: 105 mmol/L (ref 98–111)
CO2: 25 mmol/L (ref 22–32)
Calcium: 8.8 mg/dL — ABNORMAL LOW (ref 8.9–10.3)
Creatinine, Ser: 0.75 mg/dL (ref 0.44–1.00)
GFR calc Af Amer: >60 mL/min (ref >60)
GFR calc non Af Amer: >60 mL/min (ref >60)
GLUCOSE: 106 mg/dL — AB (ref 70–99)
POTASSIUM: 3.4 mmol/L — AB (ref 3.5–5.1)
Sodium: 138 mmol/L (ref 135–145)
TOTAL PROTEIN: 7.3 g/dL (ref 6.5–8.1)

## 2018-04-14 LAB — URINALYSIS, COMPLETE (UACMP) WITH MICROSCOPIC
BILIRUBIN URINE: NEGATIVE
Glucose, UA: NEGATIVE mg/dL
HGB URINE DIPSTICK: NEGATIVE
KETONES UR: NEGATIVE mg/dL
Nitrite: NEGATIVE
PROTEIN: NEGATIVE mg/dL
Specific Gravity, Urine: 1.01 (ref 1.005–1.030)
pH: 6 (ref 5.0–8.0)

## 2018-04-14 LAB — CBC
HEMATOCRIT: 38.1 % (ref 35.0–47.0)
Hemoglobin: 12.1 g/dL (ref 12.0–16.0)
MCH: 25 pg — AB (ref 26.0–34.0)
MCHC: 31.7 g/dL — AB (ref 32.0–36.0)
MCV: 78.9 fL — AB (ref 80.0–100.0)
Platelets: 401 10*3/uL (ref 150–440)
RBC: 4.83 MIL/uL (ref 3.80–5.20)
RDW: 14.6 % — AB (ref 11.5–14.5)
WBC: 13.7 10*3/uL — ABNORMAL HIGH (ref 3.6–11.0)

## 2018-04-14 LAB — LIPASE, BLOOD: Lipase: 19 U/L (ref 11–51)

## 2018-04-14 LAB — TROPONIN I: Troponin I: <0.03 ng/mL (ref <0.03)

## 2018-04-14 LAB — POCT PREGNANCY, URINE: PREG TEST UR: NEGATIVE

## 2018-04-14 MED ORDER — SODIUM CHLORIDE 0.9 % IV BOLUS
1000.0000 mL | Freq: Once | INTRAVENOUS | Status: AC
  Administered 2018-04-14: 1000 mL via INTRAVENOUS

## 2018-04-14 MED ORDER — IOPAMIDOL (ISOVUE-370) INJECTION 76%
100.0000 mL | Freq: Once | INTRAVENOUS | Status: AC | PRN
  Administered 2018-04-14: 100 mL via INTRAVENOUS

## 2018-04-14 MED ORDER — MORPHINE SULFATE (PF) 4 MG/ML IV SOLN
4.0000 mg | Freq: Once | INTRAVENOUS | Status: AC
  Administered 2018-04-14: 4 mg via INTRAVENOUS
  Filled 2018-04-14: qty 1

## 2018-04-14 MED ORDER — ONDANSETRON HCL 4 MG/2ML IJ SOLN
4.0000 mg | Freq: Once | INTRAMUSCULAR | Status: AC
  Administered 2018-04-14: 4 mg via INTRAVENOUS
  Filled 2018-04-14: qty 2

## 2018-04-14 NOTE — ED Provider Notes (Signed)
[facility] Provider Note  ____________________________________________   First MD Initiated Contact with Patient 04/14/18 0038     (approximate)  I have reviewed the triage vital signs and the nursing notes.   HISTORY  Chief Complaint Emesis and Abdominal Pain   HPI [name] is a 47 y.o. female with a history of hypothyroidism as well as a recent cholecystectomy in late September who is presenting with several weeks of upper abdominal pain.  Says that the pain started 3 days after her cholecystectomy.  Says it is an 8 out of 10 pain that radiates through to her back.  Says it is associated with vomiting but no diarrhea.  Says that this is similar to the pain that she had before her gallbladder was taken out.  Also says that she is out of her omeprazole as well as sucralfate over the past week.   Past Medical History:  Diagnosis Date  . Asthma   . Chronic back pain   . Hypertension   . Hypothyroidism   . Knee pain, left     Patient Active Problem List   Diagnosis Date Noted  . Calculus of gallbladder without cholecystitis without obstruction   . Chronic back pain 03/13/2015  . Hypothyroidism 03/13/2015  . Hypokalemia 03/13/2015  . Abdominal wall cellulitis 03/13/2015  . Bilateral lower extremity edema 03/13/2015    Past Surgical History:  Procedure Laterality Date  . CHOLECYSTECTOMY N/A 03/26/2018   Procedure: LAPAROSCOPIC CHOLECYSTECTOMY;  Surgeon: Leafy Ro, MD;  Location: [facility] ORS;  Service: General;  Laterality: N/A;  . KNEE SURGERY Left     Prior to Admission medications   Medication Sig Start Date End Date Taking? Authorizing Provider  cyclobenzaprine (FLEXERIL) 10 MG tablet Take 10 mg by mouth 3 (three) times daily.   Yes [provider]  furosemide (LASIX) 40 MG tablet Take 40 mg by mouth 3 (three) times daily.    Yes [provider]  lisinopril (PRINIVIL,ZESTRIL) 10 MG tablet Take 10  mg by mouth daily.   Yes [provider]  methadone (DOLOPHINE) 10 MG tablet Take 10 mg by mouth every 12 (twelve) hours.    Yes [provider]  omeprazole (PRILOSEC) 20 MG capsule Take 20 mg by mouth 2 (two) times daily before a meal.   Yes [provider]  sucralfate (CARAFATE) 1 g tablet Take 1 g by mouth 4 (four) times daily. 03/06/18  Yes [provider]  oxyCODONE-acetaminophen (PERCOCET) 5-325 MG tablet Take 1-2 tablets by mouth every 4 (four) hours as needed for severe pain. Patient not taking: Reported on 04/14/2018 03/26/18 03/26/19  Leafy Ro, MD    Allergies Vancomycin  Family History  Problem Relation Age of Onset  . Diabetes Unknown   . CAD Unknown   . Stroke Mother   . Learning disabilities Mother   . Lung cancer Mother   . Heart disease Father     Social History Social History   Tobacco Use  . Smoking status: Never Smoker  . Smokeless tobacco: Never Used  Substance Use Topics  . Alcohol use: No    Alcohol/week: 0.0 standard drinks  . Drug use: No    Review of Systems  Constitutional: No fever/chills Eyes: No visual changes. ENT: No sore throat. Cardiovascular: Denies chest pain. Respiratory: Denies shortness of breath. Gastrointestinal:   No diarrhea.  No constipation. Genitourinary: Negative for dysuria. Musculoskeletal: As above Skin: Negative for rash. Neurological: Negative for headaches,  focal weakness or numbness.   ____________________________________________   PHYSICAL EXAM:  VITAL SIGNS: ED Triage Vitals [04/14/18 0034]  Enc Vitals Group     BP (!) 149/79     Pulse Rate (!) 105     Resp 18     Temp 98.6 F (37 C)     Temp Source Oral     SpO2 99 %     Weight 268 lb (121.6 kg)     Height 5\' 5"  (1.651 m)     Head Circumference      Peak Flow      Pain Score 8     Pain Loc      Pain Edu?      Excl. in GC?     Constitutional: Alert and oriented. Well appearing and in no acute  distress. Eyes: Conjunctivae are normal.  Head: Atraumatic. Nose: No congestion/rhinnorhea. Mouth/Throat: Mucous membranes are moist.  Neck: No stridor.   Cardiovascular: Tachycardic, regular rhythm. Grossly normal heart sounds.  Respiratory: Normal respiratory effort.  No retractions. Lungs CTAB. Gastrointestinal: Soft with moderate epigastric tenderness without rebound or guarding.  Minimal right upper quadrant tenderness palpation with mild left upper quadrant tenderness to palpation but no tenderness palpation of the lower abdomen.  No distention. No CVA tenderness. Musculoskeletal: No lower extremity tenderness nor edema.  No joint effusions. Neurologic:  Normal speech and language. No gross focal neurologic deficits are appreciated. Skin:  Skin is warm, dry and intact. No rash noted. Psychiatric: Mood and affect are normal. Speech and behavior are normal.  ____________________________________________   LABS (all labs ordered are listed, but only abnormal results are displayed)  Labs Reviewed  COMPREHENSIVE METABOLIC PANEL - Abnormal; Notable for the following components:      Result Value   Potassium 3.4 (*)    Glucose, Bld 106 (*)    Calcium 8.8 (*)    All other components within normal limits  CBC - Abnormal; Notable for the following components:   WBC 13.7 (*)    MCV 78.9 (*)    MCH 25.0 (*)    MCHC 31.7 (*)    RDW 14.6 (*)    All other components within normal limits  URINALYSIS, COMPLETE (UACMP) WITH MICROSCOPIC - Abnormal; Notable for the following components:   Color, Urine YELLOW (*)    APPearance HAZY (*)    Leukocytes, UA TRACE (*)    Bacteria, UA RARE (*)    All other components within normal limits  URINE CULTURE  LIPASE, BLOOD  TROPONIN I  POC URINE PREG, ED  POCT PREGNANCY, URINE   ____________________________________________  EKG  ED ECG REPORT I, Arelia Longest, the attending physician, personally viewed and interpreted this ECG.   Date:  04/14/2018  EKG Time: 0214  Rate: 93  Rhythm: normal sinus rhythm  Axis: Normal  Intervals:none  ST&T Change: No ST segment elevation or depression.  No abnormal T wave inversion.  ____________________________________________  RADIOLOGY  Patient without evidence of acute pulmonary embolism.  No specific abnormality to explain the patient's abdominal pain.  Possible pancreatitis but will require clinical correlation as well as correlation with lab work. ____________________________________________   PROCEDURES  Procedure(s) performed:   Procedures  Critical Care performed:   ____________________________________________   INITIAL IMPRESSION / ASSESSMENT AND PLAN / ED COURSE  Pertinent labs & imaging results that were available during my care of the patient were reviewed by me and considered in my medical decision making (see chart for details).  Differential  diagnosis includes, but is not limited to, biliary disease (biliary colic, acute cholecystitis, cholangitis, choledocholithiasis, etc), intrathoracic causes for epigastric abdominal pain including ACS, gastritis, duodenitis, pancreatitis, small bowel or large bowel obstruction, abdominal aortic aneurysm, hernia, and ulcer(s). As part of my medical decision making, I reviewed the following data within the electronic MEDICAL RECORD NUMBER Notes from prior ED visits   ----------------------------------------- 3:29 AM on 04/14/2018 -----------------------------------------  Patient with reassuring imaging.  Lipase normal.  Patient states that she is off of her sucralfate as well as omeprazole but will be able to pick them up in the morning.  She will continue to try and get an appointment with gastroenterology.  We reviewed the imaging as well as lab results.  Patient feeling improved at this time and tolerating p.o. fluids as well as crackers.  Will be discharged home.  She is understanding the diagnosis as well as treatment and  willing to comply.  Heart rate also now down to the 90s.  Heart rate at this time 93 bpm. ____________________________________________   FINAL CLINICAL IMPRESSION(S) / ED DIAGNOSES  Upper abdominal pain with nausea and vomiting.  NEW MEDICATIONS STARTED DURING THIS VISIT:  New Prescriptions   No medications on file     Note:  This document was prepared using Dragon voice recognition software and may include unintentional dictation errors.     Myrna Blazer, MD 04/14/18 0330

## 2018-04-14 NOTE — ED Notes (Signed)
Patient discharged to home per MD order. Patient in stable condition, and deemed medically cleared by ED provider for discharge. Discharge instructions reviewed with patient/family using "Teach Back"; verbalized understanding of medication education and administration, and information about follow-up care. Denies further concerns. ° °

## 2018-04-14 NOTE — ED Triage Notes (Signed)
Pt ambulatory to triage.  Pt has abd pain with vomiting.  Pt had gallbladder removed last month.  No diarrhea  Pt alert.

## 2018-04-15 ENCOUNTER — Encounter: Payer: Self-pay | Admitting: Surgery

## 2018-04-15 ENCOUNTER — Encounter: Payer: Self-pay | Admitting: Gastroenterology

## 2018-04-15 ENCOUNTER — Other Ambulatory Visit: Payer: Self-pay

## 2018-04-15 ENCOUNTER — Ambulatory Visit (INDEPENDENT_AMBULATORY_CARE_PROVIDER_SITE_OTHER): Payer: Medicare Other | Admitting: Gastroenterology

## 2018-04-15 ENCOUNTER — Encounter: Payer: Self-pay | Admitting: Student

## 2018-04-15 ENCOUNTER — Ambulatory Visit (INDEPENDENT_AMBULATORY_CARE_PROVIDER_SITE_OTHER): Payer: Medicare Other | Admitting: Surgery

## 2018-04-15 VITALS — BP 114/80 | HR 102 | Temp 97.3°F | Ht 69.0 in | Wt 269.0 lb

## 2018-04-15 VITALS — BP 112/71 | HR 84 | Ht 69.0 in | Wt 271.0 lb

## 2018-04-15 DIAGNOSIS — R1013 Epigastric pain: Secondary | ICD-10-CM

## 2018-04-15 DIAGNOSIS — Z09 Encounter for follow-up examination after completed treatment for conditions other than malignant neoplasm: Secondary | ICD-10-CM

## 2018-04-15 LAB — URINE CULTURE

## 2018-04-15 MED ORDER — ONDANSETRON 4 MG PO TBDP: 4.0000 mg | ORAL_TABLET | Freq: Three times a day (TID) | ORAL | 0 refills | Status: AC | PRN

## 2018-04-15 MED ORDER — DICYCLOMINE HCL 10 MG PO CAPS: 10.0000 mg | ORAL_CAPSULE | Freq: Three times a day (TID) | ORAL | 0 refills | Status: DC

## 2018-04-15 NOTE — Progress Notes (Signed)
[name] MD, MRCP(U.K) [address]  [address]  [place], [place] [address]  Main: [phone]  Fax: [phone]   Gastroenterology Consultation  Referring Provider:     Dortha Kern, MD Primary Care Physician:  Dortha Kern, MD Primary Gastroenterologist:  Dr. [name]  Reason for Consultation:   Er referral for abdominal pain          HPI:   [facility] is a 47 y.o. y/o female referred for consultation & management  by Dr. Quillian Quince, Doreene Nest, MD.    She presented to the ER yesterday at midnight . She underwent a cholecystectomy in Sept 2019 . 3 days after surgery she had pain similar to what she had prior to surgery . Sent home on PPI yesterday .+  CT abdomen and CT angio chest showed no gross abnormality - focal pancreatitis could not be ruled out. Bacteria seen in urine. Hb 12.1 grams liver function tests's ,lipase normal.    No dysuria, no fevers, no excess urination .   Today she says her stomach hurts.  Abdominal pain: Onset: since June - every day -continious  Site :Epigastric , a few months back - carafate and PPI helped a bit , subsequently didn't  Radiation: localized  Severity :8/10  Nature of pain: constant , burning , pressure  Aggravating factors: eating  Relieving factors :nothing , also hurts when she sits up  Weight loss: up and down.  NSAID use: no  PPI use :yes Gall bladder surgery: yes  Frequency of bowel movements: used to once a week, presently has a bowel movement once every day but last 2 days has not had any  Change in bowel movements: no  Relief with bowel movements: no  Gas/Bloating/Abdominal distension: no   No oxycodone.  She has had a prior EGD at age 63 and was told she has a hiatal hernia  Past Medical History:  Diagnosis Date  . Asthma   . Chronic back pain   . Hypertension   . Hypothyroidism   . Knee pain, left     Past Surgical History:  Procedure Laterality Date  . CHOLECYSTECTOMY N/A 03/26/2018   Procedure:  LAPAROSCOPIC CHOLECYSTECTOMY;  Surgeon: Leafy Ro, MD;  Location: [facility] ORS;  Service: General;  Laterality: N/A;  . KNEE SURGERY Left     Prior to Admission medications   Medication Sig Start Date End Date Taking? Authorizing Provider  cyclobenzaprine (FLEXERIL) 10 MG tablet Take 10 mg by mouth 3 (three) times daily.   Yes [provider]  furosemide (LASIX) 40 MG tablet Take 40 mg by mouth 3 (three) times daily.    Yes [provider]  lisinopril (PRINIVIL,ZESTRIL) 10 MG tablet Take 10 mg by mouth daily.   Yes [provider]  methadone (DOLOPHINE) 10 MG tablet Take 10 mg by mouth every 12 (twelve) hours.    Yes [provider]  omeprazole (PRILOSEC) 20 MG capsule Take 20 mg by mouth 2 (two) times daily before a meal.   Yes [provider]  sucralfate (CARAFATE) 1 g tablet Take 1 g by mouth 4 (four) times daily. 03/06/18  Yes [provider]  oxyCODONE-acetaminophen (PERCOCET) 5-325 MG tablet Take 1-2 tablets by mouth every 4 (four) hours as needed for severe pain. Patient not taking: Reported on 04/14/2018 03/26/18 03/26/19  Leafy Ro, MD    Family History  Problem Relation Age of Onset  . Diabetes Unknown   . CAD Unknown   .  Stroke Mother   . Learning disabilities Mother   . Lung cancer Mother   . Heart disease Father      Social History   Tobacco Use  . Smoking status: Never Smoker  . Smokeless tobacco: Never Used  Substance Use Topics  . Alcohol use: No    Alcohol/week: 0.0 standard drinks  . Drug use: No    Allergies as of 04/15/2018 - Review Complete 04/15/2018  Allergen Reaction Noted  . Vancomycin Hives and Other (See Comments) 03/13/2015    Review of Systems:    All systems reviewed and negative except where noted in HPI.   Physical Exam:  BP 112/71   Pulse 84   Ht 5\' 9"  (1.753 m)   Wt 271 lb (122.9 kg)   LMP 04/03/2018 (Approximate)   BMI 40.02 kg/m  Patient's last menstrual period was  04/03/2018 (approximate). Psych:  Alert and cooperative. Normal mood and affect. General:   Alert,  Well-developed, well-nourished, pleasant and cooperative in NAD Head:  Normocephalic and atraumatic. Eyes:  Sclera clear, no icterus.   Conjunctiva pink. Ears:  Normal auditory acuity. Nose:  No deformity, discharge, or lesions. Mouth:  No deformity or lesions,oropharynx pink & moist. Neck:  Supple; no masses or thyromegaly. Lungs:  Respirations even and unlabored.  Clear throughout to auscultation.   No wheezes, crackles, or rhonchi. No acute distress. Heart:  Regular rate and rhythm; no murmurs, clicks, rubs, or gallops. Abdomen:  Normal bowel sounds.  No bruits.  Soft, mild epigastric tenderness  and non-distended without masses, hepatosplenomegaly or hernias noted.  No guarding or rebound tenderness.    Neurologic:  Alert and oriented x3;  grossly normal neurologically. Skin:  Intact without significant lesions or rashes. No jaundice. Lymph Nodes:  No significant cervical adenopathy. Psych:  Alert and cooperative. Normal mood and affect.  Imaging Studies: Ct Angio Chest Pe W And/or Wo Contrast  Result Date: 04/14/2018 CLINICAL DATA:  Abdominal pain and vomiting. Acute onset shortness of breath. EXAM: CT ANGIOGRAPHY CHEST CT ABDOMEN AND PELVIS WITH CONTRAST TECHNIQUE: Multidetector CT imaging of the chest was performed using the standard protocol during bolus administration of intravenous contrast. Multiplanar CT image reconstructions and MIPs were obtained to evaluate the vascular anatomy. Multidetector CT imaging of the abdomen and pelvis was performed using the standard protocol during bolus administration of intravenous contrast. CONTRAST:  ISOVUE-370 IOPAMIDOL (ISOVUE-370) INJECTION 76% COMPARISON:  Abdominal ultrasound 03/19/2018 FINDINGS: Body habitus reduces diagnostic sensitivity and specificity. CTA CHEST FINDINGS Cardiovascular: No filling defect is identified in the pulmonary  arterial tree to suggest pulmonary embolus. Mild cardiomegaly.  No acute aortic findings. Mediastinum/Nodes: Unremarkable Lungs/Pleura: Several small areas of presumed air trapping leading to accentuated lucency noted in the lungs. Otherwise unremarkable. Musculoskeletal: Thoracic spondylosis. Review of the MIP images confirms the above findings. CT ABDOMEN and PELVIS FINDINGS Hepatobiliary: Cholecystectomy with trace edema along the gallbladder fossa, the patient had cholecystectomy 19 days prior to imaging. The amount of trace edema in this region is considered within normal limits given this timeframe. No significant biliary dilatation. Pancreas: There is some relative diffuse hypoenhancement of the pancreatic head with respect to the tail on the portal venous phase images. Otherwise unremarkable. Spleen: Unremarkable Adrenals/Urinary Tract: Unremarkable Stomach/Bowel: Unremarkable.  Appendix normal. Vascular/Lymphatic: Borderline enlarged right external iliac lymph node at 1.0 cm in diameter. Peripancreatic node 0.8 cm in short axis on image 22/11. Reproductive: Hypodense lesions in the posterior and left posterior uterus likely represent centrally necrotic fibroids. Ovaries unremarkable.  Other: No supplemental non-categorized findings. Musculoskeletal: Paraumbilical laparoscopy port site noted. 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 Review of the MIP images confirms the above findings. IMPRESSION: 1. No specific abnormality to explain the patient's abdominal pain and vomiting. 2. No filling defect is identified in the pulmonary arterial tree to suggest pulmonary embolus. 3. Mild air trapping in several small areas of the lung. 4. Hypoenhancement of the pancreatic head with respect to the pancreatic tail. Although possibly from relative atrophy, strictly speaking I cannot exclude focal pancreatitis, correlate with lipase levels. The appearance has some accentuation of pancreatic fatty  stapling and accordingly I do not feel like the appearance is due to tumor. 5. Uterine fibroids. Electronically Signed   By: Gaylyn Rong M.D.   On: 04/14/2018 03:03   Ct Abdomen Pelvis W Contrast  Result Date: 04/14/2018 CLINICAL DATA:  Abdominal pain and vomiting. Acute onset shortness of breath. EXAM: CT ANGIOGRAPHY CHEST CT ABDOMEN AND PELVIS WITH CONTRAST TECHNIQUE: Multidetector CT imaging of the chest was performed using the standard protocol during bolus administration of intravenous contrast. Multiplanar CT image reconstructions and MIPs were obtained to evaluate the vascular anatomy. Multidetector CT imaging of the abdomen and pelvis was performed using the standard protocol during bolus administration of intravenous contrast. CONTRAST:  ISOVUE-370 IOPAMIDOL (ISOVUE-370) INJECTION 76% COMPARISON:  Abdominal ultrasound 03/19/2018 FINDINGS: Body habitus reduces diagnostic sensitivity and specificity. CTA CHEST FINDINGS Cardiovascular: No filling defect is identified in the pulmonary arterial tree to suggest pulmonary embolus. Mild cardiomegaly.  No acute aortic findings. Mediastinum/Nodes: Unremarkable Lungs/Pleura: Several small areas of presumed air trapping leading to accentuated lucency noted in the lungs. Otherwise unremarkable. Musculoskeletal: Thoracic spondylosis. Review of the MIP images confirms the above findings. CT ABDOMEN and PELVIS FINDINGS Hepatobiliary: Cholecystectomy with trace edema along the gallbladder fossa, the patient had cholecystectomy 19 days prior to imaging. The amount of trace edema in this region is considered within normal limits given this timeframe. No significant biliary dilatation. Pancreas: There is some relative diffuse hypoenhancement of the pancreatic head with respect to the tail on the portal venous phase images. Otherwise unremarkable. Spleen: Unremarkable Adrenals/Urinary Tract: Unremarkable Stomach/Bowel: Unremarkable.  Appendix normal.  Vascular/Lymphatic: Borderline enlarged right external iliac lymph node at 1.0 cm in diameter. Peripancreatic node 0.8 cm in short axis on image 22/11. Reproductive: Hypodense lesions in the posterior and left posterior uterus likely represent centrally necrotic fibroids. Ovaries unremarkable. Other: No supplemental non-categorized findings. Musculoskeletal: Paraumbilical laparoscopy port site noted. 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 Review of the MIP images confirms the above findings. IMPRESSION: 1. No specific abnormality to explain the patient's abdominal pain and vomiting. 2. No filling defect is identified in the pulmonary arterial tree to suggest pulmonary embolus. 3. Mild air trapping in several small areas of the lung. 4. Hypoenhancement of the pancreatic head with respect to the pancreatic tail. Although possibly from relative atrophy, strictly speaking I cannot exclude focal pancreatitis, correlate with lipase levels. The appearance has some accentuation of pancreatic fatty stapling and accordingly I do not feel like the appearance is due to tumor. 5. Uterine fibroids. Electronically Signed   By: Gaylyn Rong M.D.   On: 04/14/2018 03:03   US Abdomen Limited Ruq  Result Date: 03/19/2018 CLINICAL DATA:  Nausea and vomiting. EXAM: ULTRASOUND ABDOMEN LIMITED RIGHT UPPER QUADRANT COMPARISON:  Ultrasound the abdomen 03/11/2018 FINDINGS: Gallbladder: Gallbladder wall is normal in thickness, 1.7 millimeters. Numerous layering stones are present, measuring on the order  of 1 centimeter and smaller. No pericholecystic fluid or sonographic Murphy's sign. Common bile duct: Diameter: 4.1 millimeter Liver: First the liver is mildly echogenic without focal liver lesions. Portal vein is patent on color Doppler imaging with normal direction of blood flow towards the liver. IMPRESSION: Cholelithiasis without other evidence for acute cholecystitis. Electronically Signed   By:  Norva Pavlov M.D.   On: 03/19/2018 16:23    Assessment and Plan:   BRION HEDGES is a 47 y.o. y/o female has been referred for epigastric pain since may 2019 - not better with double dose PPI. S/p cholecystectomy. ?fatty pancreas on CT scan   Plan  1. EGD tomorrow 2. H pylori breath test  3. Bentyl PRN 4. Likely will need MRI pancreas in 2-3 months   I have discussed alternative options, risks & benefits,  which include, but are not limited to, bleeding, infection, perforation,respiratory complication & drug reaction.  The patient agrees with this plan & written consent will be obtained.     Follow up in 3 months   Dr [name] MD,MRCP(U.K)

## 2018-04-15 NOTE — Patient Instructions (Addendum)
Please call our office if you have questions or concerns.    Please be sure to follow up with Dr.Anna.   We will see you back in 2 weeks.

## 2018-04-16 ENCOUNTER — Encounter: Payer: Self-pay | Admitting: *Deleted

## 2018-04-16 ENCOUNTER — Encounter
Admission: RE | Disposition: A | Discharge status: Home / Self Care | Payer: Self-pay | Source: Ambulatory Visit | Attending: Gastroenterology

## 2018-04-16 ENCOUNTER — Ambulatory Visit: Payer: Medicare Other | Admitting: Certified Registered"

## 2018-04-16 ENCOUNTER — Encounter: Payer: Self-pay | Admitting: Surgery

## 2018-04-16 ENCOUNTER — Ambulatory Visit
Admission: RE | Admit: 2018-04-16 | Discharge: 2018-04-16 | Disposition: A | Discharge status: Home / Self Care | Payer: Medicare Other | Source: Ambulatory Visit | Attending: Gastroenterology | Admitting: Gastroenterology

## 2018-04-16 DIAGNOSIS — E039 Hypothyroidism, unspecified: Secondary | ICD-10-CM | POA: Diagnosis not present

## 2018-04-16 DIAGNOSIS — R1013 Epigastric pain: Secondary | ICD-10-CM | POA: Diagnosis not present

## 2018-04-16 DIAGNOSIS — K2950 Unspecified chronic gastritis without bleeding: Secondary | ICD-10-CM | POA: Insufficient documentation

## 2018-04-16 DIAGNOSIS — Z79899 Other long term (current) drug therapy: Secondary | ICD-10-CM | POA: Insufficient documentation

## 2018-04-16 DIAGNOSIS — J45909 Unspecified asthma, uncomplicated: Secondary | ICD-10-CM | POA: Insufficient documentation

## 2018-04-16 DIAGNOSIS — I10 Essential (primary) hypertension: Secondary | ICD-10-CM | POA: Insufficient documentation

## 2018-04-16 HISTORY — PX: ESOPHAGOGASTRODUODENOSCOPY (EGD) WITH PROPOFOL: SHX5813

## 2018-04-16 LAB — H. PYLORI BREATH TEST: H PYLORI BREATH TEST: NEGATIVE

## 2018-04-16 LAB — POCT PREGNANCY, URINE: Preg Test, Ur: NEGATIVE

## 2018-04-16 SURGERY — ESOPHAGOGASTRODUODENOSCOPY (EGD) WITH PROPOFOL
Anesthesia: General

## 2018-04-16 MED ORDER — MIDAZOLAM HCL 2 MG/2ML IJ SOLN
INTRAMUSCULAR | Status: DC | PRN
  Administered 2018-04-16: 2 mg via INTRAVENOUS

## 2018-04-16 MED ORDER — LIDOCAINE HCL (CARDIAC) PF 100 MG/5ML IV SOSY
PREFILLED_SYRINGE | INTRAVENOUS | Status: DC | PRN
  Administered 2018-04-16: 100 mg via INTRAVENOUS

## 2018-04-16 MED ORDER — PHENYLEPHRINE HCL 10 MG/ML IJ SOLN
INTRAMUSCULAR | Status: AC
  Filled 2018-04-16: qty 1

## 2018-04-16 MED ORDER — PROPOFOL 500 MG/50ML IV EMUL
INTRAVENOUS | Status: AC
  Filled 2018-04-16: qty 50

## 2018-04-16 MED ORDER — SODIUM CHLORIDE 0.9 % IV SOLN
INTRAVENOUS | Status: DC
  Administered 2018-04-16: 1000 mL via INTRAVENOUS

## 2018-04-16 MED ORDER — MIDAZOLAM HCL 2 MG/2ML IJ SOLN
INTRAMUSCULAR | Status: AC
  Filled 2018-04-16: qty 2

## 2018-04-16 MED ORDER — PROPOFOL 10 MG/ML IV BOLUS
INTRAVENOUS | Status: DC | PRN
  Administered 2018-04-16: 10 mg via INTRAVENOUS
  Administered 2018-04-16: 90 mg via INTRAVENOUS

## 2018-04-16 MED ORDER — LIDOCAINE HCL (PF) 2 % IJ SOLN
INTRAMUSCULAR | Status: AC
  Filled 2018-04-16: qty 10

## 2018-04-16 NOTE — Progress Notes (Signed)
S/p lap chole She has being having some nausea and abd pain Went to the ER, w/u including a CT scan did not reveal biloma, abscess or complications from lap chole She is seen Dr. Tobi Bastos for EGD  And further w/u No fevers or chills CMP was normal Path d/w pt She does have a hx of chronic pain and is on metadone  PE NAD, obese Abd: soft, mild ttp , no peritonitis. No infection  A/P Abd pain Await upper scope I will see her back in 10 days or so If sxs persist may do HIDA and repeat work up No need for emergent surgical interventionsI

## 2018-04-16 NOTE — Transfer of Care (Signed)
Immediate Anesthesia Transfer of Care Note  Patient: [facility]  Procedure(s) Performed: ESOPHAGOGASTRODUODENOSCOPY (EGD) WITH PROPOFOL (N/A )  Patient Location: PACU  Anesthesia Type:General  Level of Consciousness: drowsy and responds to stimulation  Airway & Oxygen Therapy: Patient Spontanous Breathing and Patient connected to nasal cannula oxygen  Post-op Assessment: Report given to RN and Post -op Vital signs reviewed and stable  Post vital signs: Reviewed and stable  Last Vitals:  Vitals Value Taken Time  BP 98/51 04/16/2018  8:32 AM  Temp 36.7 C 04/16/2018  8:30 AM  Pulse 90 04/16/2018  8:32 AM  Resp 24 04/16/2018  8:32 AM  SpO2 96 % 04/16/2018  8:32 AM    Last Pain:  Vitals:   04/16/18 0830  TempSrc: Tympanic  PainSc:          Complications: No apparent anesthesia complications

## 2018-04-16 NOTE — Anesthesia Preprocedure Evaluation (Signed)
Anesthesia Evaluation  Patient identified by MRN, date of birth, ID band Patient awake    Reviewed: Allergy & Precautions, H&P , NPO status , Patient's Chart, lab work & pertinent test results, reviewed documented beta blocker date and time   History of Anesthesia Complications Negative for: history of anesthetic complications  Airway Mallampati: III  TM Distance: >3 FB Neck ROM: full    Dental  (+) Teeth Intact, Dental Advidsory Given   Pulmonary neg shortness of breath, asthma (as a child) , neg recent URI,           Cardiovascular hypertension, (-) angina(-) CAD, (-) Past MI, (-) Cardiac Stents and (-) CABG (-) dysrhythmias + Valvular Problems/Murmurs MVP      Neuro/Psych negative neurological ROS  negative psych ROS   GI/Hepatic Neg liver ROS, GERD  ,  Endo/Other  neg diabetesHypothyroidism   Renal/GU negative Renal ROS  negative genitourinary   Musculoskeletal   Abdominal   Peds  Hematology negative hematology ROS (+)   Anesthesia Other Findings Past Medical History: No date: Chronic back pain No date: Hypothyroidism No date: Knee pain, left Past Surgical History: No date: KNEE SURGERY; Left No date: NO PAST SURGERIES   Reproductive/Obstetrics negative OB ROS                             Anesthesia Physical  Anesthesia Plan  ASA: III  Anesthesia Plan: General   Post-op Pain Management:    Induction: Intravenous  PONV Risk Score and Plan: 3 and Propofol infusion and TIVA  Airway Management Planned: Nasal Cannula and Natural Airway  Additional Equipment:   Intra-op Plan:   Post-operative Plan:   Informed Consent: I have reviewed the patients History and Physical, chart, labs and discussed the procedure including the risks, benefits and alternatives for the proposed anesthesia with the patient or authorized representative who has indicated his/her understanding and  acceptance.   Dental Advisory Given  Plan Discussed with: CRNA  Anesthesia Plan Comments:         Anesthesia Quick Evaluation

## 2018-04-16 NOTE — Anesthesia Post-op Follow-up Note (Signed)
Anesthesia QCDR form completed.        

## 2018-04-16 NOTE — H&P (Signed)
[name], MD [address], [place], [place], [address] [address], [address], [place], [place], [address] Phone: [phone]  Fax: [phone]  Primary Care Physician:  Dortha Kern, MD   Pre-Procedure History & Physical: HPI:  Amy Brown is a 47 y.o. female is here for an endoscopy    Past Medical History:  Diagnosis Date  . Asthma   . Chronic back pain   . Hypertension   . Hypothyroidism   . Knee pain, left     Past Surgical History:  Procedure Laterality Date  . CHOLECYSTECTOMY N/A 03/26/2018   Procedure: LAPAROSCOPIC CHOLECYSTECTOMY;  Surgeon: Leafy Ro, MD;  Location: [facility] ORS;  Service: General;  Laterality: N/A;  . KNEE SURGERY Left     Prior to Admission medications   Medication Sig Start Date End Date Taking? Authorizing Provider  cyclobenzaprine (FLEXERIL) 10 MG tablet Take 10 mg by mouth 3 (three) times daily.   Yes [provider]  dicyclomine (BENTYL) 10 MG capsule Take 1 capsule (10 mg total) by mouth 4 (four) times daily -  before meals and at bedtime. 04/15/18  Yes [name], MD  furosemide (LASIX) 40 MG tablet Take 40 mg by mouth 3 (three) times daily.    Yes [provider]  lisinopril (PRINIVIL,ZESTRIL) 10 MG tablet Take 10 mg by mouth daily.   Yes [provider]  methadone (DOLOPHINE) 10 MG tablet Take 10 mg by mouth every 12 (twelve) hours.    Yes [provider]  omeprazole (PRILOSEC) 20 MG capsule Take 20 mg by mouth 2 (two) times daily before a meal.   Yes [provider]  ondansetron (ZOFRAN ODT) 4 MG disintegrating tablet Take 1 tablet (4 mg total) by mouth every 8 (eight) hours as needed for nausea or vomiting (take every 6 hours for nausea.). 04/15/18  Yes Pabon, Diego F, MD  sucralfate (CARAFATE) 1 g tablet Take 1 g by mouth 4 (four) times daily. 03/06/18  Yes [provider]    Allergies as of 04/15/2018 - Review Complete 04/15/2018  Allergen  Reaction Noted  . Vancomycin Hives and Other (See Comments) 03/13/2015    Family History  Problem Relation Age of Onset  . Diabetes Unknown   . CAD Unknown   . Stroke Mother   . Learning disabilities Mother   . Lung cancer Mother   . Heart disease Father     Social History   Socioeconomic History  . Marital status: Single    Spouse name: Not on file  . Number of children: Not on file  . Years of education: Not on file  . Highest education level: Not on file  Occupational History  . Not on file  Social Needs  . Financial resource strain: Not on file  . Food insecurity:    Worry: Not on file    Inability: Not on file  . Transportation needs:    Medical: Not on file    Non-medical: Not on file  Tobacco Use  . Smoking status: Never Smoker  . Smokeless tobacco: Never Used  Substance and Sexual Activity  . Alcohol use: No    Alcohol/week: 0.0 standard drinks  . Drug use: No  . Sexual activity: Not on file  Lifestyle  . Physical activity:    Days per week: Not on file    Minutes per session: Not on file  . Stress: Not on file  Relationships  . Social connections:    Talks on phone: Not on file    Gets together: Not on file    Attends religious service: Not on file    Active member of club or organization: Not on file    Attends meetings of clubs or organizations: Not on file    Relationship status: Not on file  . Intimate partner violence:    Fear of current or ex partner: Not on file    Emotionally abused: Not on file    Physically abused: Not on file    Forced sexual activity: Not on file  Other Topics Concern  . Not on file  Social History Narrative  . Not on file    Review of Systems: See HPI, otherwise negative ROS  Physical Exam: BP (!) 143/96   Pulse 98   Temp (!) 97 F (36.1 C) (Tympanic)   Resp 20   Ht 5\' 10"  (1.778 m)   Wt 118.8 kg   LMP 04/03/2018 (Approximate) Comment: Urine Preg. Negative.  SpO2 100%   BMI 37.59 kg/m  General:    Alert,  pleasant and cooperative in NAD Head:  Normocephalic and atraumatic. Neck:  Supple; no masses or thyromegaly. Lungs:  Clear throughout to auscultation, normal respiratory effort.    Heart:  +S1, +S2, Regular rate and rhythm, No edema. Abdomen:  Soft, nontender and nondistended. Normal bowel sounds, without guarding, and without rebound.   Neurologic:  Alert and  oriented x4;  grossly normal neurologically.  Impression/Plan: [name] is here for an endoscopy  to be performed for  evaluation of abdominal pain    Risks, benefits, limitations, and alternatives regarding endoscopy have been reviewed with the patient.  Questions have been answered.  All parties agreeable.   [name], MD  04/16/2018, 8:09 AM

## 2018-04-16 NOTE — Op Note (Signed)
[facility] Gastroenterology Patient Name: Amy Brown Procedure Date: 04/16/2018 7:28 AM MRN: [id number] Account #: [id number] Date of Birth: [birth year] Admit Type: Outpatient Age: 47 Room: [facility] ENDO ROOM 4 Gender: Female Note Status: Finalized Procedure:            Upper GI endoscopy Indications:          Epigastric abdominal pain Providers:            [name] MD, MD Referring MD:         Dortha Kern (Referring MD) Medicines:            Monitored Anesthesia Care Complications:        No immediate complications. Procedure:            Pre-Anesthesia Assessment:                       - Prior to the procedure, a History and Physical was                        performed, and patient medications, allergies and                        sensitivities were reviewed. The patient's tolerance of                        previous anesthesia was reviewed.                       - The risks and benefits of the procedure and the                        sedation options and risks were discussed with the                        patient. All questions were answered and informed                        consent was obtained.                       - ASA Grade Assessment: II - A patient with mild                        systemic disease.                       After obtaining informed consent, the endoscope was                        passed under direct vision. Throughout the procedure,                        the patient's blood pressure, pulse, and oxygen                        saturations were monitored continuously. The Endoscope                        was introduced through the mouth, and advanced to the  third part of duodenum. The upper GI endoscopy was                        accomplished with ease. The patient tolerated the                        procedure well. Findings:      The esophagus was normal.      A large amount of food (residue) was found in  the entire examined       stomach.      Normal mucosa was found in the gastric antrum. Biopsies were taken with       a cold forceps for histology.      The examined duodenum was normal. Impression:           - Normal esophagus.                       - A large amount of food (residue) in the stomach.                       - Normal mucosa was found in the antrum. Biopsied.                       - Normal examined duodenum. Recommendation:       - Discharge patient to home (with escort).                       - Resume previous diet.                       - Continue present medications.                       - Await pathology results.                       - Repeat upper endoscopy in 2 weeks because the                        preparation was poor.                       - Suggest 24 hours of clears before procedure Procedure Code(s):    --- Professional ---                       [phone], Esophagogastroduodenoscopy, flexible, transoral;                        with biopsy, single or multiple Diagnosis Code(s):    --- Professional ---                       R10.13, Epigastric pain CPT copyright 2018 American Medical Association. All rights reserved. The codes documented in this report are preliminary and upon coder review may  be revised to meet current compliance requirements. [name], MD [name] MD, MD 04/16/2018 8:27:50 AM This report has been signed electronically. Number of Addenda: 0 Note Initiated On: 04/16/2018 7:28 AM      [facility]

## 2018-04-16 NOTE — Anesthesia Postprocedure Evaluation (Signed)
Anesthesia Post Note  Patient: Amy Brown  Procedure(s) Performed: ESOPHAGOGASTRODUODENOSCOPY (EGD) WITH PROPOFOL (N/A )  Patient location during evaluation: Endoscopy Anesthesia Type: General Level of consciousness: awake and alert Pain management: pain level controlled Vital Signs Assessment: post-procedure vital signs reviewed and stable Respiratory status: spontaneous breathing, nonlabored ventilation, respiratory function stable and patient connected to nasal cannula oxygen Cardiovascular status: blood pressure returned to baseline and stable Postop Assessment: no apparent nausea or vomiting Anesthetic complications: no     Last Vitals:  Vitals:   04/16/18 0850 04/16/18 0900  BP: (!) 98/57   Pulse: 82 87  Resp: 18 19  Temp:    SpO2: 100% 100%    Last Pain:  Vitals:   04/16/18 0830  TempSrc: Tympanic  PainSc:                  Lenard Simmer

## 2018-04-17 ENCOUNTER — Encounter: Payer: Self-pay | Admitting: Gastroenterology

## 2018-04-17 ENCOUNTER — Other Ambulatory Visit: Payer: Self-pay

## 2018-04-17 ENCOUNTER — Telehealth: Payer: Self-pay

## 2018-04-17 DIAGNOSIS — R1013 Epigastric pain: Secondary | ICD-10-CM

## 2018-04-17 LAB — SURGICAL PATHOLOGY

## 2018-04-17 NOTE — Telephone Encounter (Signed)
-----   Message from [name], MD sent at 04/16/2018  8:36 AM EDT ----- Regarding: please arrange appointment  Amy Brown  Please arrange repeat EGD next week as she had food in her stomach - 24 hours of clears   Regards    Dr [name]  Gastroenterology/Hepatology Pager: [phone]

## 2018-04-17 NOTE — Telephone Encounter (Signed)
Spoke with pt and informed her of Dr. Johnney Killian instructions to schedule a repeat EGD and to start a clear liquid diet 1 day prior to procedure. Pt agrees and procedure has been scheduled for 04-23-18.

## 2018-04-19 ENCOUNTER — Encounter: Payer: Self-pay | Admitting: Gastroenterology

## 2018-04-20 ENCOUNTER — Encounter: Payer: Medicare Other | Admitting: Surgery

## 2018-04-22 ENCOUNTER — Encounter: Payer: Self-pay | Admitting: *Deleted

## 2018-04-23 ENCOUNTER — Ambulatory Visit: Payer: Medicare Other | Admitting: Anesthesiology

## 2018-04-23 ENCOUNTER — Encounter
Admission: RE | Disposition: A | Discharge status: Home / Self Care | Payer: Self-pay | Source: Ambulatory Visit | Attending: Gastroenterology

## 2018-04-23 ENCOUNTER — Encounter: Payer: Self-pay | Admitting: *Deleted

## 2018-04-23 ENCOUNTER — Ambulatory Visit
Admission: RE | Admit: 2018-04-23 | Discharge: 2018-04-23 | Disposition: A | Discharge status: Home / Self Care | Payer: Medicare Other | Source: Ambulatory Visit | Attending: Gastroenterology | Admitting: Gastroenterology

## 2018-04-23 DIAGNOSIS — Z6836 Body mass index (BMI) 36.0-36.9, adult: Secondary | ICD-10-CM | POA: Insufficient documentation

## 2018-04-23 DIAGNOSIS — M549 Dorsalgia, unspecified: Secondary | ICD-10-CM | POA: Diagnosis not present

## 2018-04-23 DIAGNOSIS — J45909 Unspecified asthma, uncomplicated: Secondary | ICD-10-CM | POA: Diagnosis not present

## 2018-04-23 DIAGNOSIS — R1013 Epigastric pain: Secondary | ICD-10-CM | POA: Insufficient documentation

## 2018-04-23 DIAGNOSIS — Z8249 Family history of ischemic heart disease and other diseases of the circulatory system: Secondary | ICD-10-CM | POA: Diagnosis not present

## 2018-04-23 DIAGNOSIS — I10 Essential (primary) hypertension: Secondary | ICD-10-CM | POA: Diagnosis not present

## 2018-04-23 DIAGNOSIS — G8929 Other chronic pain: Secondary | ICD-10-CM | POA: Diagnosis not present

## 2018-04-23 DIAGNOSIS — E039 Hypothyroidism, unspecified: Secondary | ICD-10-CM | POA: Diagnosis not present

## 2018-04-23 DIAGNOSIS — Z881 Allergy status to other antibiotic agents status: Secondary | ICD-10-CM | POA: Insufficient documentation

## 2018-04-23 DIAGNOSIS — Z79899 Other long term (current) drug therapy: Secondary | ICD-10-CM | POA: Insufficient documentation

## 2018-04-23 DIAGNOSIS — E669 Obesity, unspecified: Secondary | ICD-10-CM | POA: Insufficient documentation

## 2018-04-23 HISTORY — PX: ESOPHAGOGASTRODUODENOSCOPY (EGD) WITH PROPOFOL: SHX5813

## 2018-04-23 LAB — POCT PREGNANCY, URINE: Preg Test, Ur: NEGATIVE

## 2018-04-23 SURGERY — ESOPHAGOGASTRODUODENOSCOPY (EGD) WITH PROPOFOL
Anesthesia: General

## 2018-04-23 MED ORDER — PROPOFOL 500 MG/50ML IV EMUL
INTRAVENOUS | Status: DC | PRN
  Administered 2018-04-23: 120 ug/kg/min via INTRAVENOUS

## 2018-04-23 MED ORDER — LIDOCAINE HCL (PF) 2 % IJ SOLN
INTRAMUSCULAR | Status: AC
  Filled 2018-04-23: qty 10

## 2018-04-23 MED ORDER — LIDOCAINE HCL (CARDIAC) PF 100 MG/5ML IV SOSY
PREFILLED_SYRINGE | INTRAVENOUS | Status: DC | PRN
  Administered 2018-04-23: 30 mg via INTRAVENOUS

## 2018-04-23 MED ORDER — ONDANSETRON HCL 4 MG/2ML IJ SOLN
INTRAMUSCULAR | Status: AC
  Filled 2018-04-23: qty 2

## 2018-04-23 MED ORDER — SODIUM CHLORIDE 0.9 % IV SOLN
INTRAVENOUS | Status: DC
  Administered 2018-04-23: 1000 mL via INTRAVENOUS

## 2018-04-23 MED ORDER — PROPOFOL 500 MG/50ML IV EMUL
INTRAVENOUS | Status: AC
  Filled 2018-04-23: qty 50

## 2018-04-23 MED ORDER — FENTANYL CITRATE (PF) 100 MCG/2ML IJ SOLN
INTRAMUSCULAR | Status: DC | PRN
  Administered 2018-04-23: 50 ug via INTRAVENOUS

## 2018-04-23 MED ORDER — MIDAZOLAM HCL 2 MG/2ML IJ SOLN
INTRAMUSCULAR | Status: AC
  Filled 2018-04-23: qty 2

## 2018-04-23 MED ORDER — MIDAZOLAM HCL 2 MG/2ML IJ SOLN
INTRAMUSCULAR | Status: DC | PRN
  Administered 2018-04-23: 2 mg via INTRAVENOUS

## 2018-04-23 MED ORDER — FENTANYL CITRATE (PF) 100 MCG/2ML IJ SOLN
INTRAMUSCULAR | Status: AC
  Filled 2018-04-23: qty 2

## 2018-04-23 NOTE — H&P (Signed)
[name], MD [address], [address], [place], [place], [address] [address], [place], [place], [address] Phone: [phone]  Fax: [phone]  Primary Care Physician:  Dortha Kern, MD   Pre-Procedure History & Physical: HPI:  Amy Brown is a 47 y.o. female is here for an endoscopy    Past Medical History:  Diagnosis Date  . Asthma   . Chronic back pain   . Hypertension   . Hypothyroidism   . Knee pain, left     Past Surgical History:  Procedure Laterality Date  . CHOLECYSTECTOMY N/A 03/26/2018   Procedure: LAPAROSCOPIC CHOLECYSTECTOMY;  Surgeon: Leafy Ro, MD;  Location: [facility] ORS;  Service: General;  Laterality: N/A;  . ESOPHAGOGASTRODUODENOSCOPY (EGD) WITH PROPOFOL N/A 04/16/2018   Procedure: ESOPHAGOGASTRODUODENOSCOPY (EGD) WITH PROPOFOL;  Surgeon: [name], MD;  Location: [facility] ENDOSCOPY;  Service: Gastroenterology;  Laterality: N/A;  . KNEE SURGERY Left     Prior to Admission medications   Medication Sig Start Date End Date Taking? Authorizing Provider  cyclobenzaprine (FLEXERIL) 10 MG tablet Take 10 mg by mouth 3 (three) times daily.   Yes [provider]  dicyclomine (BENTYL) 10 MG capsule Take 1 capsule (10 mg total) by mouth 4 (four) times daily -  before meals and at bedtime. 04/15/18  Yes [name], MD  furosemide (LASIX) 40 MG tablet Take 40 mg by mouth 3 (three) times daily.    Yes [provider]  lisinopril (PRINIVIL,ZESTRIL) 10 MG tablet Take 10 mg by mouth daily.   Yes [provider]  methadone (DOLOPHINE) 10 MG tablet Take 10 mg by mouth every 12 (twelve) hours.    Yes [provider]  omeprazole (PRILOSEC) 20 MG capsule Take 20 mg by mouth 2 (two) times daily before a meal.   Yes [provider]  ondansetron (ZOFRAN ODT) 4 MG disintegrating tablet Take 1 tablet (4 mg total) by mouth every 8 (eight) hours as needed for nausea or vomiting (take every 6 hours for nausea.). 04/15/18   Yes Pabon, Diego F, MD  sucralfate (CARAFATE) 1 g tablet Take 1 g by mouth 4 (four) times daily. 03/06/18  Yes [provider]    Allergies as of 04/17/2018 - Review Complete 04/16/2018  Allergen Reaction Noted  . Vancomycin Hives and Other (See Comments) 03/13/2015    Family History  Problem Relation Age of Onset  . Diabetes Unknown   . CAD Unknown   . Stroke Mother   . Learning disabilities Mother   . Lung cancer Mother   . Heart disease Father     Social History   Socioeconomic History  . Marital status: Single    Spouse name: Not on file  . Number of children: Not on file  . Years of education: Not on file  . Highest education level: Not on file  Occupational History  . Not on file  Social Needs  . Financial resource strain: Not on file  . Food insecurity:    Worry: Not on file    Inability: Not on file  . Transportation needs:    Medical: Not on file    Non-medical: Not on file  Tobacco Use  . Smoking status: Never Smoker  . Smokeless tobacco: Never Used  Substance and Sexual Activity  . Alcohol use: No    Alcohol/week: 0.0 standard drinks  . Drug use: Never  . Sexual activity: Not on file  Lifestyle  . Physical activity:  Days per week: Not on file    Minutes per session: Not on file  . Stress: Not on file  Relationships  . Social connections:    Talks on phone: Not on file    Gets together: Not on file    Attends religious service: Not on file    Active member of club or organization: Not on file    Attends meetings of clubs or organizations: Not on file    Relationship status: Not on file  . Intimate partner violence:    Fear of current or ex partner: Not on file    Emotionally abused: Not on file    Physically abused: Not on file    Forced sexual activity: Not on file  Other Topics Concern  . Not on file  Social History Narrative  . Not on file    Review of Systems: See HPI, otherwise negative ROS  Physical Exam: BP (!)  130/94   Pulse 91   Temp 97.9 F (36.6 C) (Tympanic)   Resp 20   Ht 5\' 10"  (1.778 m)   Wt 114.3 kg   LMP 04/03/2018 (Approximate) Comment: Urine Preg. Negative.  SpO2 100%   BMI 36.16 kg/m  General:   Alert,  pleasant and cooperative in NAD Head:  Normocephalic and atraumatic. Neck:  Supple; no masses or thyromegaly. Lungs:  Clear throughout to auscultation, normal respiratory effort.    Heart:  +S1, +S2, Regular rate and rhythm, No edema. Abdomen:  Soft, nontender and nondistended. Normal bowel sounds, without guarding, and without rebound.   Neurologic:  Alert and  oriented x4;  grossly normal neurologically.  Impression/Plan: Amy Brown is here for an endoscopy  to be performed for  evaluation of abdominal pain    Risks, benefits, limitations, and alternatives regarding endoscopy have been reviewed with the patient.  Questions have been answered.  All parties agreeable.   [name], MD  04/23/2018, 9:23 AM

## 2018-04-23 NOTE — Anesthesia Postprocedure Evaluation (Signed)
Anesthesia Post Note  Patient: Jara Feider Hoheisel  Procedure(s) Performed: ESOPHAGOGASTRODUODENOSCOPY (EGD) WITH PROPOFOL (N/A )  Patient location during evaluation: Endoscopy Anesthesia Type: General Level of consciousness: awake and alert and oriented Pain management: pain level controlled Vital Signs Assessment: post-procedure vital signs reviewed and stable Respiratory status: spontaneous breathing, nonlabored ventilation and respiratory function stable Cardiovascular status: blood pressure returned to baseline and stable Postop Assessment: no signs of nausea or vomiting Anesthetic complications: no     Last Vitals:  Vitals:   04/23/18 1020 04/23/18 1030  BP: 122/88 137/88  Pulse: 83 81  Resp: 18 16  Temp:    SpO2: 94% 94%    Last Pain:  Vitals:   04/23/18 1030  TempSrc:   PainSc: 0-No pain                 Nina Hoar

## 2018-04-23 NOTE — Anesthesia Preprocedure Evaluation (Signed)
Anesthesia Evaluation  Patient identified by MRN, date of birth, ID band Patient awake    Reviewed: Allergy & Precautions, NPO status , Patient's Chart, lab work & pertinent test results  History of Anesthesia Complications Negative for: history of anesthetic complications  Airway Mallampati: II  TM Distance: >3 FB Neck ROM: Full    Dental no notable dental hx.    Pulmonary asthma ,    breath sounds clear to auscultation- rhonchi (-) wheezing      Cardiovascular hypertension, Pt. on medications (-) CAD, (-) Past MI, (-) Cardiac Stents and (-) CABG  Rhythm:Regular Rate:Normal - Systolic murmurs and - Diastolic murmurs    Neuro/Psych negative neurological ROS  negative psych ROS   GI/Hepatic negative GI ROS, Neg liver ROS,   Endo/Other  neg diabetesHypothyroidism   Renal/GU negative Renal ROS     Musculoskeletal negative musculoskeletal ROS (+)   Abdominal (+) + obese,   Peds  Hematology negative hematology ROS (+)   Anesthesia Other Findings Past Medical History: No date: Asthma No date: Chronic back pain No date: Hypertension No date: Hypothyroidism No date: Knee pain, left   Reproductive/Obstetrics                             Anesthesia Physical Anesthesia Plan  ASA: II  Anesthesia Plan: General   Post-op Pain Management:    Induction: Intravenous  PONV Risk Score and Plan: 2 and Propofol infusion  Airway Management Planned: Natural Airway  Additional Equipment:   Intra-op Plan:   Post-operative Plan:   Informed Consent: I have reviewed the patients History and Physical, chart, labs and discussed the procedure including the risks, benefits and alternatives for the proposed anesthesia with the patient or authorized representative who has indicated his/her understanding and acceptance.   Dental advisory given  Plan Discussed with: CRNA and  Anesthesiologist  Anesthesia Plan Comments:         Anesthesia Quick Evaluation

## 2018-04-23 NOTE — Anesthesia Post-op Follow-up Note (Signed)
Anesthesia QCDR form completed.        

## 2018-04-23 NOTE — Transfer of Care (Signed)
Immediate Anesthesia Transfer of Care Note  Patient: Amy Brown  Procedure(s) Performed: ESOPHAGOGASTRODUODENOSCOPY (EGD) WITH PROPOFOL (N/A )  Patient Location: PACU  Anesthesia Type:General  Level of Consciousness: awake and sedated  Airway & Oxygen Therapy: Patient Spontanous Breathing and Patient connected to face mask oxygen  Post-op Assessment: Report given to RN and Post -op Vital signs reviewed and stable  Post vital signs: Reviewed and stable  Last Vitals:  Vitals Value Taken Time  BP    Temp    Pulse    Resp    SpO2      Last Pain:  Vitals:   04/23/18 0902  TempSrc: Tympanic  PainSc: 0-No pain         Complications: No apparent anesthesia complications

## 2018-04-23 NOTE — Op Note (Signed)
[facility] Gastroenterology Patient Name: Amy Brown Procedure Date: 04/23/2018 9:33 AM MRN: [id number] Account #: [id number] Date of Birth: 06-14-1971 Admit Type: Outpatient Age: 47 Room: [facility] ENDO ROOM 3 Gender: Female Note Status: Finalized Procedure:            Upper GI endoscopy Indications:          Epigastric abdominal pain Providers:            [name] MD, MD Referring MD:         Dortha Kern (Referring MD) Medicines:            Monitored Anesthesia Care Complications:        No immediate complications. Procedure:            Pre-Anesthesia Assessment:                       - Prior to the procedure, a History and Physical was                        performed, and patient medications, allergies and                        sensitivities were reviewed. The patient's tolerance of                        previous anesthesia was reviewed.                       - Prior to the procedure, a History and Physical was                        performed, and patient medications, allergies and                        sensitivities were reviewed. The patient's tolerance of                        previous anesthesia was reviewed.                       - The risks and benefits of the procedure and the                        sedation options and risks were discussed with the                        patient. All questions were answered and informed                        consent was obtained.                       - ASA Grade Assessment: II - A patient with mild                        systemic disease.                       After obtaining informed consent, the endoscope was  passed under direct vision. Throughout the procedure,                        the patient's blood pressure, pulse, and oxygen                        saturations were monitored continuously. The Endoscope                        was introduced through the mouth, and advanced to  the                        third part of duodenum. The upper GI endoscopy was                        accomplished with ease. The patient tolerated the                        procedure well. Findings:      The esophagus was normal.      The stomach was normal.      The examined duodenum was normal. Impression:           - Normal esophagus.                       - Normal stomach.                       - Normal examined duodenum.                       - No specimens collected. Recommendation:       - Discharge patient to home (with escort).                       - Resume previous diet.                       - Continue present medications.                       - Do a gastric emptying study in 2 weeks. Procedure Code(s):    --- Professional ---                       [phone], Esophagogastroduodenoscopy, flexible, transoral;                        diagnostic, including collection of specimen(s) by                        brushing or washing, when performed (separate procedure) Diagnosis Code(s):    --- Professional ---                       R10.13, Epigastric pain CPT copyright 2018 American Medical Association. All rights reserved. The codes documented in this report are preliminary and upon coder review may  be revised to meet current compliance requirements. [name], MD [name] MD, MD 04/23/2018 9:52:48 AM This report has been signed electronically. Number of Addenda: 0 Note Initiated On: 04/23/2018 9:33 AM      [facility]

## 2018-04-24 ENCOUNTER — Telehealth: Payer: Self-pay

## 2018-04-24 ENCOUNTER — Other Ambulatory Visit: Payer: Self-pay

## 2018-04-24 DIAGNOSIS — R112 Nausea with vomiting, unspecified: Secondary | ICD-10-CM

## 2018-04-24 DIAGNOSIS — R1013 Epigastric pain: Secondary | ICD-10-CM

## 2018-04-24 NOTE — Telephone Encounter (Signed)
Spoke with pt and informed her that we have scheduled the gastric emptying study. Pt is aware of her appointment date, time, location, and prior prep instructions.

## 2018-04-24 NOTE — Telephone Encounter (Signed)
-----   Message from [name], MD sent at 04/23/2018  9:58 AM EDT ----- Regarding: please arrange appointment  Amy Brown  Please arrange gastric emptying study    Regards    Dr [name]  Gastroenterology/Hepatology Pager: [phone]

## 2018-04-29 ENCOUNTER — Ambulatory Visit: Payer: Medicare Other | Admitting: Surgery

## 2018-05-02 ENCOUNTER — Encounter: Payer: Self-pay | Admitting: Emergency Medicine

## 2018-05-02 ENCOUNTER — Emergency Department
Admission: EM | Admit: 2018-05-02 | Discharge: 2018-05-03 | Disposition: A | Discharge status: Home / Self Care | Payer: Medicare Other | Attending: Emergency Medicine | Admitting: Emergency Medicine

## 2018-05-02 ENCOUNTER — Other Ambulatory Visit: Payer: Self-pay

## 2018-05-02 DIAGNOSIS — R112 Nausea with vomiting, unspecified: Secondary | ICD-10-CM | POA: Insufficient documentation

## 2018-05-02 DIAGNOSIS — J45909 Unspecified asthma, uncomplicated: Secondary | ICD-10-CM | POA: Insufficient documentation

## 2018-05-02 DIAGNOSIS — R1011 Right upper quadrant pain: Secondary | ICD-10-CM | POA: Insufficient documentation

## 2018-05-02 DIAGNOSIS — I10 Essential (primary) hypertension: Secondary | ICD-10-CM | POA: Diagnosis not present

## 2018-05-02 DIAGNOSIS — Z79899 Other long term (current) drug therapy: Secondary | ICD-10-CM | POA: Diagnosis not present

## 2018-05-02 DIAGNOSIS — E039 Hypothyroidism, unspecified: Secondary | ICD-10-CM | POA: Insufficient documentation

## 2018-05-02 LAB — COMPREHENSIVE METABOLIC PANEL
ALT: 20 U/L (ref 0–44)
AST: 27 U/L (ref 15–41)
Albumin: 3.3 g/dL — ABNORMAL LOW (ref 3.5–5.0)
Alkaline Phosphatase: 82 U/L (ref 38–126)
Anion gap: 10 (ref 5–15)
BUN: 14 mg/dL (ref 6–20)
CHLORIDE: 103 mmol/L (ref 98–111)
CO2: 23 mmol/L (ref 22–32)
Calcium: 8.6 mg/dL — ABNORMAL LOW (ref 8.9–10.3)
Creatinine, Ser: 0.78 mg/dL (ref 0.44–1.00)
Glucose, Bld: 118 mg/dL — ABNORMAL HIGH (ref 70–99)
Potassium: 3.3 mmol/L — ABNORMAL LOW (ref 3.5–5.1)
Sodium: 136 mmol/L (ref 135–145)
Total Bilirubin: 0.6 mg/dL (ref 0.3–1.2)
Total Protein: 7 g/dL (ref 6.5–8.1)

## 2018-05-02 LAB — CBC
HEMATOCRIT: 37.7 % (ref 36.0–46.0)
HEMOGLOBIN: 12 g/dL (ref 12.0–15.0)
MCH: 25.6 pg — ABNORMAL LOW (ref 26.0–34.0)
MCHC: 31.8 g/dL (ref 30.0–36.0)
MCV: 80.6 fL (ref 80.0–100.0)
Platelets: 384 10*3/uL (ref 150–400)
RBC: 4.68 MIL/uL (ref 3.87–5.11)
RDW: 14.8 % (ref 11.5–15.5)
WBC: 13.6 10*3/uL — AB (ref 4.0–10.5)
nRBC: 0 % (ref 0.0–0.2)

## 2018-05-02 LAB — URINALYSIS, COMPLETE (UACMP) WITH MICROSCOPIC
BILIRUBIN URINE: NEGATIVE
Glucose, UA: NEGATIVE mg/dL
KETONES UR: NEGATIVE mg/dL
NITRITE: NEGATIVE
Protein, ur: NEGATIVE mg/dL
SPECIFIC GRAVITY, URINE: 1.021 (ref 1.005–1.030)
pH: 6 (ref 5.0–8.0)

## 2018-05-02 LAB — LIPASE, BLOOD: LIPASE: 21 U/L (ref 11–51)

## 2018-05-02 LAB — POCT PREGNANCY, URINE: PREG TEST UR: NEGATIVE

## 2018-05-02 NOTE — ED Triage Notes (Signed)
Pt arrives POV and ambulatory with steady gait to triage with c/o emesis since she had her gallbladder removed on the 19th of September. Pt is in NAD.

## 2018-05-03 ENCOUNTER — Emergency Department: Payer: Medicare Other

## 2018-05-03 DIAGNOSIS — R1011 Right upper quadrant pain: Secondary | ICD-10-CM | POA: Diagnosis not present

## 2018-05-03 MED ORDER — METOCLOPRAMIDE HCL 10 MG PO TABS: 10.0000 mg | ORAL_TABLET | Freq: Three times a day (TID) | ORAL | 0 refills | Status: AC | PRN

## 2018-05-03 MED ORDER — METOCLOPRAMIDE HCL 5 MG/ML IJ SOLN
5.0000 mg | Freq: Once | INTRAMUSCULAR | Status: AC
  Administered 2018-05-03: 5 mg via INTRAVENOUS
  Filled 2018-05-03: qty 2

## 2018-05-03 MED ORDER — DEXTROSE-NACL 5-0.45 % IV SOLN
INTRAVENOUS | Status: DC
  Administered 2018-05-03: 01:00:00 via INTRAVENOUS

## 2018-05-03 MED ORDER — SODIUM CHLORIDE 0.9 % IV SOLN
1.0000 g | Freq: Once | INTRAVENOUS | Status: AC
  Administered 2018-05-03: 1 g via INTRAVENOUS
  Filled 2018-05-03: qty 10

## 2018-05-03 MED ORDER — HYDROCOD POLST-CPM POLST ER 10-8 MG/5ML PO SUER: 5.0000 mL | Freq: Two times a day (BID) | ORAL | 0 refills | Status: AC

## 2018-05-03 NOTE — ED Provider Notes (Signed)
[facility] Emergency Department Provider Note   ____________________________________________   First MD Initiated Contact with Patient 05/03/18 0001     (approximate)  I have reviewed the triage vital signs and the nursing notes.   HISTORY  Chief Complaint Emesis    HPI Amy Brown is a 47 y.o. female who presents to the ED from home with a chief complaint of right upper quadrant abdominal pain and nausea/vomiting.  Patient has had similar symptoms since September.  She had the symptoms leading to a laparoscopic cholecystectomy.  However, has had continued symptoms.  Has been seen by both general surgery as well as GI.  Recently had normal endoscopy.  She is scheduled for a gastric emptying study next week.  Reports continued symptoms despite omeprazole, Carafate and Bentyl.  Denies associated fever, chills, chest pain, shortness of breath, dysuria, diarrhea.  Has had nonproductive cough.  Denies recent travel or trauma.   Past Medical History:  Diagnosis Date  . Asthma   . Chronic back pain   . Hypertension   . Hypothyroidism   . Knee pain, left     Patient Active Problem List   Diagnosis Date Noted  . Calculus of gallbladder without cholecystitis without obstruction   . Chronic back pain 03/13/2015  . Hypothyroidism 03/13/2015  . Hypokalemia 03/13/2015  . Abdominal wall cellulitis 03/13/2015  . Bilateral lower extremity edema 03/13/2015    Past Surgical History:  Procedure Laterality Date  . CHOLECYSTECTOMY N/A 03/26/2018   Procedure: LAPAROSCOPIC CHOLECYSTECTOMY;  Surgeon: Leafy Ro, MD;  Location: [facility] ORS;  Service: General;  Laterality: N/A;  . ESOPHAGOGASTRODUODENOSCOPY (EGD) WITH PROPOFOL N/A 04/16/2018   Procedure: ESOPHAGOGASTRODUODENOSCOPY (EGD) WITH PROPOFOL;  Surgeon: [name], MD;  Location: [facility] ENDOSCOPY;  Service: Gastroenterology;  Laterality: N/A;  . ESOPHAGOGASTRODUODENOSCOPY (EGD) WITH PROPOFOL N/A 04/23/2018   Procedure: ESOPHAGOGASTRODUODENOSCOPY (EGD) WITH PROPOFOL;  Surgeon: [name], MD;  Location: [facility] ENDOSCOPY;  Service: Gastroenterology;  Laterality: N/A;  . KNEE SURGERY Left     Prior to Admission medications   Medication Sig Start Date End Date Taking? Authorizing Provider  cyclobenzaprine (FLEXERIL) 10 MG tablet Take 10 mg by mouth 3 (three) times daily.    [provider]  dicyclomine (BENTYL) 10 MG capsule Take 1 capsule (10 mg total) by mouth 4 (four) times daily -  before meals and at bedtime. 04/15/18   [name], MD  furosemide (LASIX) 40 MG tablet Take 40 mg by mouth 3 (three) times daily.     [provider]  lisinopril (PRINIVIL,ZESTRIL) 10 MG tablet Take 10 mg by mouth daily.    [provider]  methadone (DOLOPHINE) 10 MG tablet Take 10 mg by mouth every 12 (twelve) hours.     [provider]  omeprazole (PRILOSEC) 20 MG capsule Take 20 mg by mouth 2 (two) times daily before a meal.    [provider]  ondansetron (ZOFRAN ODT) 4 MG disintegrating tablet Take 1 tablet (4 mg total) by mouth every 8 (eight) hours as needed for nausea or vomiting (take every 6 hours for nausea.). 04/15/18   Pabon, Diego F, MD  sucralfate (CARAFATE) 1 g tablet Take 1 g by mouth 4 (four) times daily. 03/06/18   [provider]    Allergies Vancomycin  Family History  Problem Relation Age of Onset  . Diabetes Unknown   . CAD Unknown   . Stroke Mother   . Learning disabilities Mother   . Lung cancer Mother   .  Heart disease Father     Social History Social History   Tobacco Use  . Smoking status: Never Smoker  . Smokeless tobacco: Never Used  Substance Use Topics  . Alcohol use: No    Alcohol/week: 0.0 standard drinks  . Drug use: Never    Review of Systems  Constitutional: No fever/chills Eyes: No visual changes. ENT: No sore throat. Cardiovascular: Denies chest pain. Respiratory: Denies shortness of  breath. Gastrointestinal: Positive for abdominal pain, nausea and vomiting.  No diarrhea.  No constipation. Genitourinary: Negative for dysuria. Musculoskeletal: Negative for back pain. Skin: Negative for rash. Neurological: Negative for headaches, focal weakness or numbness.   ____________________________________________   PHYSICAL EXAM:  VITAL SIGNS: ED Triage Vitals  Enc Vitals Group     BP 05/02/18 1951 (!) 135/103     Pulse Rate 05/02/18 1951 91     Resp 05/02/18 1951 16     Temp 05/02/18 1951 98.7 F (37.1 C)     Temp Source 05/02/18 1951 Oral     SpO2 05/02/18 1951 97 %     Weight 05/02/18 1949 252 lb (114.3 kg)     Height 05/02/18 1949 5\' 9"  (1.753 m)     Head Circumference --      Peak Flow --      Pain Score 05/02/18 1949 8     Pain Loc --      Pain Edu? --      Excl. in GC? --     Constitutional: Alert and oriented. Well appearing and in no acute distress.  Texting on her cell phone. Eyes: Conjunctivae are normal. PERRL. EOMI. Head: Atraumatic. Nose: No congestion/rhinnorhea. Mouth/Throat: Mucous membranes are moist.  Oropharynx non-erythematous. Neck: No stridor.   Cardiovascular: Normal rate, regular rhythm. Grossly normal heart sounds.  Good peripheral circulation. Respiratory: Normal respiratory effort.  No retractions. Lungs CTAB.  Dry cough noted. Gastrointestinal: Soft and mildly tender to palpation right upper quadrant without rebound or guarding. No distention. No abdominal bruits. No CVA tenderness. Musculoskeletal: No lower extremity tenderness nor edema.  No joint effusions. Neurologic:  Normal speech and language. No gross focal neurologic deficits are appreciated. No gait instability. Skin:  Skin is warm, dry and intact. No rash noted. Psychiatric: Mood and affect are normal. Speech and behavior are normal.  ____________________________________________   LABS (all labs ordered are listed, but only abnormal results are displayed)  Labs  Reviewed  COMPREHENSIVE METABOLIC PANEL - Abnormal; Notable for the following components:      Result Value   Potassium 3.3 (*)    Glucose, Bld 118 (*)    Calcium 8.6 (*)    Albumin 3.3 (*)    All other components within normal limits  CBC - Abnormal; Notable for the following components:   WBC 13.6 (*)    MCH 25.6 (*)    All other components within normal limits  URINALYSIS, COMPLETE (UACMP) WITH MICROSCOPIC - Abnormal; Notable for the following components:   Color, Urine YELLOW (*)    APPearance HAZY (*)    Hgb urine dipstick MODERATE (*)    Leukocytes, UA TRACE (*)    Bacteria, UA RARE (*)    All other components within normal limits  LIPASE, BLOOD  POC URINE PREG, ED  POCT PREGNANCY, URINE   ____________________________________________  EKG  None ____________________________________________  RADIOLOGY  ED MD interpretation: None  Official radiology report(s): No results found.  ____________________________________________   PROCEDURES  Procedure(s) performed: None  Procedures  Critical Care  performed: No  ____________________________________________   INITIAL IMPRESSION / ASSESSMENT AND PLAN / ED COURSE  As part of my medical decision making, I reviewed the following data within the electronic MEDICAL RECORD NUMBER Nursing notes reviewed and incorporated, Labs reviewed, Old chart reviewed, Radiograph reviewed  and Notes from prior ED visits   47 year old female who presents with a greater than 6-week history of upper abdominal pain associated with nausea and vomiting. Differential diagnosis includes, but is not limited to, biliary disease (biliary colic, acute cholecystitis, cholangitis, choledocholithiasis, etc), intrathoracic causes for epigastric abdominal pain including ACS, gastritis, duodenitis, pancreatitis, small bowel or large bowel obstruction, abdominal aortic aneurysm, hernia, and ulcer(s).  I personally reviewed patient's records and see that she  has had work-ups by both general surgery as well as GI with recent negative endoscopy.  She is scheduled for a gastric emptying study next week.  Laboratory results unremarkable other than stable mild leukocytosis and trace leukocytes in urine.  Will initiate IV fluid resuscitation, IV Reglan.  1 g IV Rocephin for UTI. Clinical Course as of May 04 303  Wynelle Link May 03, 2018  0303 Patient feeling significantly better.  Tolerated ice chips without emesis.  We will add urine culture.  Will discharge home on Reglan to use in addition to her other GI medications.  Strict return precautions given.  Patient verbalizes understanding and agrees with plan of care.   [JS]    Clinical Course User Index [JS] Irean Hong, MD     ____________________________________________   FINAL CLINICAL IMPRESSION(S) / ED DIAGNOSES  Final diagnoses:  Right upper quadrant abdominal pain  Non-intractable vomiting with nausea, unspecified vomiting type     ED Discharge Orders    None       Note:  This document was prepared using Dragon voice recognition software and may include unintentional dictation errors.    Irean Hong, MD 05/03/18 [phone]

## 2018-05-03 NOTE — ED Notes (Signed)
Pt stated that she has been vomiting since her gallbladder was removed. Pt stated that she just wants to have some vomiting medication to help since she hasnt ate in a couple of days.

## 2018-05-03 NOTE — Discharge Instructions (Addendum)
1.  You may add Reglan 3 times daily as needed for nausea/vomiting.  You may take these with your other GI medications. 2.  You may take Tussionex as needed for cough. 3.  Return to the ER for worsening symptoms, persistent vomiting, difficulty breathing or other concerns.

## 2018-05-04 LAB — URINE CULTURE: Culture: <10000 — AB

## 2018-05-06 ENCOUNTER — Other Ambulatory Visit: Payer: Self-pay | Admitting: Gastroenterology

## 2018-05-08 NOTE — Telephone Encounter (Signed)
PT LEFT VM SHE NEEDS REFILL ON RX DECLOMIZINE  SHE ALREADY CONTACTED HER PHARMACY

## 2018-05-09 ENCOUNTER — Encounter
Admission: RE | Admit: 2018-05-09 | Discharge: 2018-05-09 | Disposition: A | Discharge status: Home / Self Care | Payer: Medicare Other | Source: Ambulatory Visit | Attending: Gastroenterology | Admitting: Gastroenterology

## 2018-05-09 DIAGNOSIS — R1013 Epigastric pain: Secondary | ICD-10-CM | POA: Diagnosis present

## 2018-05-09 DIAGNOSIS — R112 Nausea with vomiting, unspecified: Secondary | ICD-10-CM

## 2018-05-09 MED ORDER — TECHNETIUM TC 99M SULFUR COLLOID
2.0000 | Freq: Once | INTRAVENOUS | Status: AC | PRN
  Administered 2018-05-09: 2.2 via ORAL

## 2018-05-11 ENCOUNTER — Ambulatory Visit: Payer: Medicare Other | Admitting: Gastroenterology

## 2018-05-13 ENCOUNTER — Other Ambulatory Visit: Payer: Self-pay

## 2018-05-13 ENCOUNTER — Encounter: Payer: Self-pay | Admitting: Surgery

## 2018-05-13 ENCOUNTER — Ambulatory Visit (INDEPENDENT_AMBULATORY_CARE_PROVIDER_SITE_OTHER): Payer: Medicare Other | Admitting: Surgery

## 2018-05-13 ENCOUNTER — Encounter: Payer: Self-pay | Admitting: *Deleted

## 2018-05-13 VITALS — BP 153/102 | HR 98 | Temp 97.3°F | Ht 69.0 in | Wt 264.8 lb

## 2018-05-13 DIAGNOSIS — R112 Nausea with vomiting, unspecified: Secondary | ICD-10-CM

## 2018-05-13 NOTE — Progress Notes (Signed)
Patient has been scheduled for a right upper quadrant abdominal ultrasound at [facility] for 05-18-18 at 10:15 am (arrive 10 am). Prep: NPO after midnight.   The patient will follow up for results in the office as scheduled.

## 2018-05-13 NOTE — Patient Instructions (Signed)
We will see you back in the office to discuss Ultrasound results.

## 2018-05-14 ENCOUNTER — Ambulatory Visit: Payer: Medicare Other | Admitting: Gastroenterology

## 2018-05-15 ENCOUNTER — Encounter: Payer: Self-pay | Admitting: Surgery

## 2018-05-15 NOTE — Progress Notes (Signed)
S/p lap chole several weeks ago Upper and lower scopes unremarkable Ct scan nml Continue to have some pain  PE NAD Abd: soft, mild TTP epigastric area, no peritonitis  A/p Persistent abd pain even after lap chole, gi w/u so far negative. We will add RUQ U/S to prove that there are no other potential issues RTC 2 weeks

## 2018-05-18 ENCOUNTER — Ambulatory Visit
Admission: RE | Admit: 2018-05-18 | Discharge: 2018-05-18 | Disposition: A | Discharge status: Home / Self Care | Payer: Medicare Other | Source: Ambulatory Visit | Attending: Surgery | Admitting: Surgery

## 2018-05-18 DIAGNOSIS — R112 Nausea with vomiting, unspecified: Secondary | ICD-10-CM | POA: Insufficient documentation

## 2018-05-19 ENCOUNTER — Telehealth: Payer: Self-pay

## 2018-05-19 NOTE — Telephone Encounter (Signed)
Contacted patient per Dr.Pabon -normal ultrasound results.

## 2018-05-24 ENCOUNTER — Encounter: Payer: Self-pay | Admitting: Gastroenterology

## 2018-05-25 ENCOUNTER — Other Ambulatory Visit: Payer: Self-pay | Admitting: Gastroenterology

## 2018-05-27 ENCOUNTER — Ambulatory Visit: Payer: Medicare Other | Admitting: Surgery

## 2018-06-02 ENCOUNTER — Ambulatory Visit: Payer: Medicare Other | Admitting: Gastroenterology

## 2018-07-22 ENCOUNTER — Ambulatory Visit: Payer: Medicare Other | Admitting: Gastroenterology

## 2018-07-22 ENCOUNTER — Encounter: Payer: Self-pay | Admitting: Gastroenterology

## 2018-07-22 DIAGNOSIS — R1013 Epigastric pain: Secondary | ICD-10-CM

## 2018-07-31 ENCOUNTER — Emergency Department
Admission: EM | Admit: 2018-07-31 | Discharge: 2018-07-31 | Disposition: A | Discharge status: Home / Self Care | Payer: Medicare Other | Attending: Emergency Medicine | Admitting: Emergency Medicine

## 2018-07-31 ENCOUNTER — Other Ambulatory Visit: Payer: Self-pay

## 2018-07-31 DIAGNOSIS — Z79899 Other long term (current) drug therapy: Secondary | ICD-10-CM | POA: Diagnosis not present

## 2018-07-31 DIAGNOSIS — E039 Hypothyroidism, unspecified: Secondary | ICD-10-CM | POA: Insufficient documentation

## 2018-07-31 DIAGNOSIS — K2970 Gastritis, unspecified, without bleeding: Secondary | ICD-10-CM | POA: Insufficient documentation

## 2018-07-31 DIAGNOSIS — R1084 Generalized abdominal pain: Secondary | ICD-10-CM | POA: Diagnosis present

## 2018-07-31 DIAGNOSIS — J45909 Unspecified asthma, uncomplicated: Secondary | ICD-10-CM | POA: Insufficient documentation

## 2018-07-31 LAB — COMPREHENSIVE METABOLIC PANEL
ALBUMIN: 4.1 g/dL (ref 3.5–5.0)
ALK PHOS: 117 U/L (ref 38–126)
ALT: 48 U/L — ABNORMAL HIGH (ref 0–44)
ANION GAP: 9 (ref 5–15)
AST: 35 U/L (ref 15–41)
BUN: 11 mg/dL (ref 6–20)
CALCIUM: 8.6 mg/dL — AB (ref 8.9–10.3)
CO2: 28 mmol/L (ref 22–32)
Chloride: 99 mmol/L (ref 98–111)
Creatinine, Ser: 0.74 mg/dL (ref 0.44–1.00)
GFR calc Af Amer: >60 mL/min (ref >60)
GFR calc non Af Amer: >60 mL/min (ref >60)
GLUCOSE: 134 mg/dL — AB (ref 70–99)
POTASSIUM: 3.5 mmol/L (ref 3.5–5.1)
SODIUM: 136 mmol/L (ref 135–145)
Total Bilirubin: 0.7 mg/dL (ref 0.3–1.2)
Total Protein: 7.7 g/dL (ref 6.5–8.1)

## 2018-07-31 LAB — URINALYSIS, COMPLETE (UACMP) WITH MICROSCOPIC
Bilirubin Urine: NEGATIVE
Glucose, UA: NEGATIVE mg/dL
Hgb urine dipstick: NEGATIVE
Ketones, ur: NEGATIVE mg/dL
Leukocytes, UA: NEGATIVE
Nitrite: NEGATIVE
PROTEIN: NEGATIVE mg/dL
Specific Gravity, Urine: 1.004 — ABNORMAL LOW (ref 1.005–1.030)
pH: 7 (ref 5.0–8.0)

## 2018-07-31 LAB — POCT PREGNANCY, URINE: PREG TEST UR: NEGATIVE

## 2018-07-31 LAB — LIPASE, BLOOD: Lipase: 19 U/L (ref 11–51)

## 2018-07-31 LAB — CBC
HCT: 40.4 % (ref 36.0–46.0)
HEMOGLOBIN: 12.7 g/dL (ref 12.0–15.0)
MCH: 25.1 pg — AB (ref 26.0–34.0)
MCHC: 31.4 g/dL (ref 30.0–36.0)
MCV: 79.8 fL — AB (ref 80.0–100.0)
Platelets: 418 10*3/uL — ABNORMAL HIGH (ref 150–400)
RBC: 5.06 MIL/uL (ref 3.87–5.11)
RDW: 15.2 % (ref 11.5–15.5)
WBC: 12.8 10*3/uL — ABNORMAL HIGH (ref 4.0–10.5)
nRBC: 0 % (ref 0.0–0.2)

## 2018-07-31 MED ORDER — LIDOCAINE VISCOUS HCL 2 % MT SOLN
15.0000 mL | Freq: Once | OROMUCOSAL | Status: AC
  Administered 2018-07-31: 15 mL via ORAL

## 2018-07-31 MED ORDER — SODIUM CHLORIDE 0.9% FLUSH: 3.0000 mL | Freq: Once | INTRAVENOUS | Status: DC

## 2018-07-31 MED ORDER — ALUM & MAG HYDROXIDE-SIMETH 200-200-20 MG/5ML PO SUSP
30.0000 mL | Freq: Once | ORAL | Status: AC
  Administered 2018-07-31: 30 mL via ORAL
  Filled 2018-07-31: qty 30

## 2018-07-31 MED ORDER — LIDOCAINE VISCOUS HCL 2 % MT SOLN: 15.0000 mL | Freq: Four times a day (QID) | OROMUCOSAL | 0 refills | Status: DC | PRN

## 2018-07-31 NOTE — ED Provider Notes (Signed)
[facility] Emergency Department Provider Note    ____________________________________________   I have reviewed the triage vital signs and the nursing notes.   HISTORY  Chief Complaint Abdominal Pain   History limited by: Not Limited   HPI Amy Brown is a 48 y.o. female who presents to the emergency department today because of concerns for abdominal pain.  Patient states that she has some issues with chronic abdominal pain.  Did have her gallbladder removed a number of months ago and states that since then she continues to have intermittent abdominal pain.  Today the pain is been worse.  Is been located primarily in the epigastric and left side of her abdomen.  She describes it as a burning sensation.  The patient did see her primary care doctor today and tried calling her GI doctor.  She is on an acids and sucralfate and states that she tied taking those without any significant relief.  She denies any fevers.  She has had some associated nausea vomiting and diarrhea.   Per medical record review patient has a history of cholectomy  Past Medical History:  Diagnosis Date  . Asthma   . Chronic back pain   . Hypertension   . Hypothyroidism   . Knee pain, left     Patient Active Problem List   Diagnosis Date Noted  . Calculus of gallbladder without cholecystitis without obstruction   . Chronic back pain 03/13/2015  . Hypothyroidism 03/13/2015  . Hypokalemia 03/13/2015  . Abdominal wall cellulitis 03/13/2015  . Bilateral lower extremity edema 03/13/2015    Past Surgical History:  Procedure Laterality Date  . CHOLECYSTECTOMY N/A 03/26/2018   Procedure: LAPAROSCOPIC CHOLECYSTECTOMY;  Surgeon: Leafy Ro, MD;  Location: [facility] ORS;  Service: General;  Laterality: N/A;  . ESOPHAGOGASTRODUODENOSCOPY (EGD) WITH PROPOFOL N/A 04/16/2018   Procedure: ESOPHAGOGASTRODUODENOSCOPY (EGD) WITH PROPOFOL;  Surgeon: [name], MD;  Location: [facility] ENDOSCOPY;   Service: Gastroenterology;  Laterality: N/A;  . ESOPHAGOGASTRODUODENOSCOPY (EGD) WITH PROPOFOL N/A 04/23/2018   Procedure: ESOPHAGOGASTRODUODENOSCOPY (EGD) WITH PROPOFOL;  Surgeon: [name], MD;  Location: [facility] ENDOSCOPY;  Service: Gastroenterology;  Laterality: N/A;  . KNEE SURGERY Left     Prior to Admission medications   Medication Sig Start Date End Date Taking? Authorizing Provider  chlorpheniramine-HYDROcodone (TUSSIONEX PENNKINETIC ER) 10-8 MG/5ML SUER Take 5 mLs by mouth 2 (two) times daily. 05/03/18   Irean Hong, MD  cyclobenzaprine (FLEXERIL) 10 MG tablet Take 10 mg by mouth 3 (three) times daily.    [provider]  dicyclomine (BENTYL) 10 MG capsule TAKE 1 CAPSULE (10 MG TOTAL) BY MOUTH 4 (FOUR) TIMES DAILY - BEFORE MEALS AND AT BEDTIME. 05/11/18   [name], MD  furosemide (LASIX) 40 MG tablet Take 40 mg by mouth 3 (three) times daily.     [provider]  lisinopril (PRINIVIL,ZESTRIL) 10 MG tablet Take 10 mg by mouth daily.    [provider]  methadone (DOLOPHINE) 10 MG tablet Take 10 mg by mouth every 12 (twelve) hours.     [provider]  metoCLOPramide (REGLAN) 10 MG tablet Take 1 tablet (10 mg total) by mouth every 8 (eight) hours as needed for nausea. 05/03/18   Irean Hong, MD  omeprazole (PRILOSEC) 20 MG capsule Take 20 mg by mouth 2 (two) times daily before a meal.    [provider]  ondansetron (ZOFRAN ODT) 4 MG disintegrating tablet Take 1 tablet (4 mg total) by mouth every 8 (eight)  hours as needed for nausea or vomiting (take every 6 hours for nausea.). 04/15/18   Pabon, Diego F, MD  sucralfate (CARAFATE) 1 g tablet Take 1 g by mouth 4 (four) times daily. 03/06/18   [provider]    Allergies Vancomycin  Family History  Problem Relation Age of Onset  . Diabetes Other   . CAD Other   . Stroke Mother   . Learning disabilities Mother   . Lung cancer Mother   . Heart disease Father     Social  History Social History   Tobacco Use  . Smoking status: Never Smoker  . Smokeless tobacco: Never Used  Substance Use Topics  . Alcohol use: No    Alcohol/week: 0.0 standard drinks  . Drug use: Never    Review of Systems Constitutional: No fever/chills Eyes: No visual changes. ENT: No sore throat. Cardiovascular: Denies chest pain. Respiratory: Denies shortness of breath. Gastrointestinal: Positive for abdominal pain, nausea and vomiting.  Genitourinary: Negative for dysuria. Musculoskeletal: Negative for back pain. Skin: Negative for rash. Neurological: Negative for headaches, focal weakness or numbness.  ____________________________________________   PHYSICAL EXAM:  VITAL SIGNS: ED Triage Vitals [07/31/18 1938]  Enc Vitals Group     BP 139/84     Pulse Rate (!) 106     Resp 18     Temp 98 F (36.7 C)     Temp src      SpO2 100 %     Weight 248 lb (112.5 kg)     Height 5\' 9"  (1.753 m)     Head Circumference      Peak Flow      Pain Score 10    Constitutional: Alert and oriented.  Eyes: Conjunctivae are normal.  ENT      Head: Normocephalic and atraumatic.      Nose: No congestion/rhinnorhea.      Mouth/Throat: Mucous membranes are moist.      Neck: No stridor. Hematological/Lymphatic/Immunilogical: No cervical lymphadenopathy. Cardiovascular: Normal rate, regular rhythm.  No murmurs, rubs, or gallops.   Respiratory: Normal respiratory effort without tachypnea nor retractions. Breath sounds are clear and equal bilaterally. No wheezes/rales/rhonchi. Gastrointestinal: Soft and slightly tender in the epigastric and left abdomen.  Genitourinary: Deferred Musculoskeletal: Normal range of motion in all extremities. No lower extremity edema. Neurologic:  Normal speech and language. No gross focal neurologic deficits are appreciated.  Skin:  Skin is warm, dry and intact. No rash noted. Psychiatric: Mood and affect are normal. Speech and behavior are normal. Patient  exhibits appropriate insight and judgment.  ____________________________________________    LABS (pertinent positives/negatives)  Upreg negative CMP wnl except glu 134, ca 8.6, alt 48 UA clear, rare bacteria otherwise unremarkable.  Lipase 19 CBC wbc 12.8, hgb 12.7, plt 418 ____________________________________________   EKG  None  ____________________________________________    RADIOLOGY  None  ____________________________________________   PROCEDURES  Procedures  ____________________________________________   INITIAL IMPRESSION / ASSESSMENT AND PLAN / ED COURSE  Pertinent labs & imaging results that were available during my care of the patient were reviewed by me and considered in my medical decision making (see chart for details).   Patient presented to the emergency department today because of concerns for abdominal pain.  I do think it is likely gastritis.  Patient was given a GI cocktail with good relief.  Patient had very mild leukocytosis on blood work however I doubt patient has significant intra-abdominal infection.  Given that she had relief with GI cocktail  will give patient prescription for some viscous lidocaine.  Discussed with patient importance of continuing her antiacid and sucralfate.  Discussed importance of continued GI follow-up.   ____________________________________________   FINAL CLINICAL IMPRESSION(S) / ED DIAGNOSES  Final diagnoses:  Gastritis, presence of bleeding unspecified, unspecified chronicity, unspecified gastritis type     Note: This dictation was prepared with Dragon dictation. Any transcriptional errors that result from this process are unintentional     Phineas Semen, MD 07/31/18 2224

## 2018-07-31 NOTE — ED Notes (Signed)

## 2018-07-31 NOTE — ED Triage Notes (Signed)
Pt comes via POV from home with c/o abdominal pain. Pt states it started about a week ago getting worse. Pt also states vomiting.  Pt states she went to PCP today and had blood work done. Pt states when she left and went home the pain got worse and was burning. Pt states she got super hot and sweaty.  Pt states she called PCP and then was advised to come to ED to be seen.

## 2018-07-31 NOTE — Discharge Instructions (Addendum)
Please seek medical attention for any high fevers, chest pain, shortness of breath, change in behavior, persistent vomiting, bloody stool or any other new or concerning symptoms.  

## 2018-08-10 ENCOUNTER — Other Ambulatory Visit: Payer: Self-pay

## 2018-08-10 ENCOUNTER — Ambulatory Visit (INDEPENDENT_AMBULATORY_CARE_PROVIDER_SITE_OTHER): Payer: Medicare Other | Admitting: Gastroenterology

## 2018-08-10 ENCOUNTER — Encounter: Payer: Self-pay | Admitting: Gastroenterology

## 2018-08-10 VITALS — BP 140/82 | HR 114 | Ht 69.0 in | Wt 267.6 lb

## 2018-08-10 DIAGNOSIS — K30 Functional dyspepsia: Secondary | ICD-10-CM

## 2018-08-10 DIAGNOSIS — R1013 Epigastric pain: Secondary | ICD-10-CM | POA: Diagnosis not present

## 2018-08-10 MED ORDER — LIDOCAINE VISCOUS HCL 2 % MT SOLN: 15.0000 mL | Freq: Four times a day (QID) | OROMUCOSAL | 0 refills | Status: AC | PRN

## 2018-08-10 MED ORDER — SUCRALFATE 1 G PO TABS: 1.0000 g | ORAL_TABLET | Freq: Four times a day (QID) | ORAL | 0 refills | Status: AC

## 2018-08-10 NOTE — Progress Notes (Signed)
Amy Mood MD, MRCP(U.K) [address]  [address]  [place], [place] [address]  Main: [phone]  Fax: [phone]   Primary Care Physician: Dortha Kern, MD  Primary Gastroenterologist:  Dr. Wyline Brown   No chief complaint on file.   HPI: Amy Brown is a 48 y.o. female  Summary of history :  She has been initially referred and seen in 04/2018 for abdominal pain . She presented in 04/2018 to the ER. She underwent a cholecystectomy in Sept 2019 . 3 days after surgery she had pain similar to what she had prior to surgery . Sent home on PPI yesterday .+  CT abdomen and CT angio chest showed no gross abnormality - focal pancreatitis could not be ruled out. Bacteria seen in urine. Hb 12.1 grams liver function tests's ,lipase normal.   She has been having abdominal pain since 12/2017 , continuous , epigastric, localized, burning and pressure in description. Constipation with no relief of pain after a bowel movement.    Interval history   04/15/2018- 08/10/2018 04/23/18 : EGD: Normal : mild chronic gastritis on bx.  04/15/18 : H pylori breath test negative.  05/10/18 : Gastric emptying study normal.  10/26/19Seen at the ER on  for RUQ Pain.  05/18/2018 : RUQ USG showed fatty changes normal common bile duct,  07/31/2018 : ER visit for abdominal pain : Given GI cocktail and discharged.   07/31/2018 : Lipase and CMP showed no significant abnormality.   Having 1 bowel movement a day - does not need to strain. Does not help with the pain, pain in the epigastrium and lower abdomen.   Lost 4 lbs. Denies any NSAID use.   On omeprazole 40 mg BID, Viscous lidocaine helps. Denies any bloating or gas .  Says when she feels she has no food in her stomach feels worse. Says didn't tolerate Amitryptaline in the past .   Current Outpatient Medications  Medication Sig Dispense Refill  . chlorpheniramine-HYDROcodone (TUSSIONEX PENNKINETIC ER) 10-8 MG/5ML SUER Take 5 mLs by mouth 2 (two)  times daily. 70 mL 0  . cyclobenzaprine (FLEXERIL) 10 MG tablet Take 10 mg by mouth 3 (three) times daily.    Marland Kitchen dicyclomine (BENTYL) 10 MG capsule TAKE 1 CAPSULE (10 MG TOTAL) BY MOUTH 4 (FOUR) TIMES DAILY - BEFORE MEALS AND AT BEDTIME. 90 capsule 0  . furosemide (LASIX) 40 MG tablet Take 40 mg by mouth 3 (three) times daily.     Marland Kitchen lidocaine (XYLOCAINE) 2 % solution Use as directed 15 mLs in the mouth or throat every 6 (six) hours as needed (abdominal pain). 100 mL 0  . lisinopril (PRINIVIL,ZESTRIL) 10 MG tablet Take 10 mg by mouth daily.    . methadone (DOLOPHINE) 10 MG tablet Take 10 mg by mouth every 12 (twelve) hours.     . metoCLOPramide (REGLAN) 10 MG tablet Take 1 tablet (10 mg total) by mouth every 8 (eight) hours as needed for nausea. 20 tablet 0  . omeprazole (PRILOSEC) 20 MG capsule Take 20 mg by mouth 2 (two) times daily before a meal.    . ondansetron (ZOFRAN ODT) 4 MG disintegrating tablet Take 1 tablet (4 mg total) by mouth every 8 (eight) hours as needed for nausea or vomiting (take every 6 hours for nausea.). 20 tablet 0  . sucralfate (CARAFATE) 1 g tablet Take 1 g by mouth 4 (four) times daily.     No current facility-administered medications for this visit.  Allergies as of 08/10/2018 - Review Complete 07/31/2018  Allergen Reaction Noted  . Vancomycin Hives and Other (See Comments) 03/13/2015    ROS:  General: Negative for anorexia, weight loss, fever, chills, fatigue, weakness. ENT: Negative for hoarseness, difficulty swallowing , nasal congestion. CV: Negative for chest pain, angina, palpitations, dyspnea on exertion, peripheral edema.  Respiratory: Negative for dyspnea at rest, dyspnea on exertion, cough, sputum, wheezing.  GI: See history of present illness. GU:  Negative for dysuria, hematuria, urinary incontinence, urinary frequency, nocturnal urination.  Endo: Negative for unusual weight change.    Physical Examination:   LMP 07/20/2018   General:  Well-nourished, well-developed in no acute distress.  Eyes: No icterus. Conjunctivae pink. Mouth: Oropharyngeal mucosa moist and pink , no lesions erythema or exudate. Lungs: Clear to auscultation bilaterally. Non-labored. Heart: Regular rate and rhythm, no murmurs rubs or gallops.  Abdomen: Bowel sounds are normal, nontender, nondistended, no hepatosplenomegaly or masses, no abdominal bruits or hernia , no rebound or guarding.   Extremities: No lower extremity edema. No clubbing or deformities. Neuro: Alert and oriented x 3.  Grossly intact. Skin: Warm and dry, no jaundice.   Psych: Alert and cooperative, normal Brown and affect.   Imaging Studies: No results found.  Assessment and Plan:   [name] is a 48 y.o. y/o female here to follow up for  epigastric pain since may 2019 - not better with double dose PPI. S/p cholecystectomy. ?fatty pancreas on CT scan . No clear etiology for the abdominal pain. 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   Plan  1. Bentyl PRN 2. Trial of IB guard samples provided 3. Commence on Elavil as a pain modulator for functional pain- will take 6 weeks to work  4. MRCP/MRI pancreas to r/o stones in CBD  5. Change Omeprazole to dexilant 60 mg - samples provided.   Dr Amy Mood  MD,MRCP [facility]) Follow up in 3-4 weeks

## 2018-08-11 ENCOUNTER — Emergency Department: Payer: Medicare Other

## 2018-08-11 ENCOUNTER — Encounter: Payer: Self-pay | Admitting: *Deleted

## 2018-08-11 ENCOUNTER — Other Ambulatory Visit: Payer: Self-pay

## 2018-08-11 ENCOUNTER — Emergency Department
Admission: EM | Admit: 2018-08-11 | Discharge: 2018-08-11 | Disposition: A | Discharge status: Home / Self Care | Payer: Medicare Other | Attending: Emergency Medicine | Admitting: Emergency Medicine

## 2018-08-11 DIAGNOSIS — I10 Essential (primary) hypertension: Secondary | ICD-10-CM | POA: Diagnosis not present

## 2018-08-11 DIAGNOSIS — R1013 Epigastric pain: Secondary | ICD-10-CM | POA: Diagnosis not present

## 2018-08-11 DIAGNOSIS — E039 Hypothyroidism, unspecified: Secondary | ICD-10-CM | POA: Diagnosis not present

## 2018-08-11 DIAGNOSIS — J45909 Unspecified asthma, uncomplicated: Secondary | ICD-10-CM | POA: Insufficient documentation

## 2018-08-11 DIAGNOSIS — Z79899 Other long term (current) drug therapy: Secondary | ICD-10-CM | POA: Diagnosis not present

## 2018-08-11 DIAGNOSIS — R1010 Upper abdominal pain, unspecified: Secondary | ICD-10-CM | POA: Diagnosis present

## 2018-08-11 LAB — CBC
HCT: 40 % (ref 36.0–46.0)
Hemoglobin: 12.9 g/dL (ref 12.0–15.0)
MCH: 25.5 pg — ABNORMAL LOW (ref 26.0–34.0)
MCHC: 32.3 g/dL (ref 30.0–36.0)
MCV: 79.2 fL — ABNORMAL LOW (ref 80.0–100.0)
NRBC: 0 % (ref 0.0–0.2)
PLATELETS: 428 10*3/uL — AB (ref 150–400)
RBC: 5.05 MIL/uL (ref 3.87–5.11)
RDW: 15.1 % (ref 11.5–15.5)
WBC: 14.3 10*3/uL — ABNORMAL HIGH (ref 4.0–10.5)

## 2018-08-11 LAB — URINALYSIS, COMPLETE (UACMP) WITH MICROSCOPIC
BILIRUBIN URINE: NEGATIVE
GLUCOSE, UA: NEGATIVE mg/dL
HGB URINE DIPSTICK: NEGATIVE
KETONES UR: NEGATIVE mg/dL
Nitrite: NEGATIVE
PH: 7 (ref 5.0–8.0)
Protein, ur: NEGATIVE mg/dL
Specific Gravity, Urine: 1.011 (ref 1.005–1.030)

## 2018-08-11 LAB — COMPREHENSIVE METABOLIC PANEL
ALBUMIN: 3.6 g/dL (ref 3.5–5.0)
ALK PHOS: 84 U/L (ref 38–126)
ALT: 21 U/L (ref 0–44)
ANION GAP: 8 (ref 5–15)
AST: 24 U/L (ref 15–41)
BILIRUBIN TOTAL: 0.5 mg/dL (ref 0.3–1.2)
BUN: 12 mg/dL (ref 6–20)
CALCIUM: 8.9 mg/dL (ref 8.9–10.3)
CO2: 28 mmol/L (ref 22–32)
CREATININE: 0.82 mg/dL (ref 0.44–1.00)
Chloride: 100 mmol/L (ref 98–111)
GFR calc Af Amer: >60 mL/min (ref >60)
GFR calc non Af Amer: >60 mL/min (ref >60)
GLUCOSE: 122 mg/dL — AB (ref 70–99)
Potassium: 3.7 mmol/L (ref 3.5–5.1)
Sodium: 136 mmol/L (ref 135–145)
Total Protein: 7.4 g/dL (ref 6.5–8.1)

## 2018-08-11 LAB — LIPASE, BLOOD: Lipase: 19 U/L (ref 11–51)

## 2018-08-11 MED ORDER — PANTOPRAZOLE SODIUM 40 MG PO TBEC
40.0000 mg | DELAYED_RELEASE_TABLET | Freq: Once | ORAL | Status: AC
  Administered 2018-08-11: 40 mg via ORAL
  Filled 2018-08-11: qty 1

## 2018-08-11 MED ORDER — MORPHINE SULFATE (PF) 2 MG/ML IV SOLN
2.0000 mg | Freq: Once | INTRAVENOUS | Status: AC
  Administered 2018-08-11: 2 mg via INTRAVENOUS
  Filled 2018-08-11: qty 1

## 2018-08-11 MED ORDER — IOPAMIDOL (ISOVUE-300) INJECTION 61%
100.0000 mL | Freq: Once | INTRAVENOUS | Status: AC | PRN
  Administered 2018-08-11: 100 mL via INTRAVENOUS

## 2018-08-11 MED ORDER — ONDANSETRON HCL 4 MG/2ML IJ SOLN
4.0000 mg | Freq: Once | INTRAMUSCULAR | Status: AC
  Administered 2018-08-11: 4 mg via INTRAVENOUS
  Filled 2018-08-11: qty 2

## 2018-08-11 MED ORDER — KETOROLAC TROMETHAMINE 30 MG/ML IJ SOLN
30.0000 mg | Freq: Once | INTRAMUSCULAR | Status: AC
  Administered 2018-08-11: 30 mg via INTRAVENOUS

## 2018-08-11 MED ORDER — KETOROLAC TROMETHAMINE 30 MG/ML IJ SOLN
INTRAMUSCULAR | Status: AC
  Administered 2018-08-11: 30 mg via INTRAVENOUS
  Filled 2018-08-11: qty 1

## 2018-08-11 NOTE — ED Triage Notes (Signed)
Pt ambulatory to triage.  Pt has abd pain and vomiting   No diarrhea.  Pt has pmd today with similar sx.  Pt taking meds without relief.  Pt alert.

## 2018-08-11 NOTE — ED Notes (Signed)
Patient returned from CT

## 2018-08-11 NOTE — ED Provider Notes (Signed)
[facility] Emergency Department Provider Note __   First MD Initiated Contact with Patient 08/11/18 0400     (approximate)  I have reviewed the triage vital signs and the nursing notes.   HISTORY  Chief Complaint Abdominal Pain and Emesis    HPI Amy Brown is a 48 y.o. female with below list of chronic medical conditions presents to the emergency department with upper abdominal pain which is currently 9 out of 10 with associated vomiting x2 days.  Patient was seen by Dr. Tobi BastosAnna gastroenterology yesterday and prescribed new medication which patient states has made symptoms worse.  Patient states that her pain has been occurring since September of last year when she had her gallbladder removed.  No fever no urinary symptoms.   Past Medical History:  Diagnosis Date  . Asthma   . Chronic back pain   . Hypertension   . Hypothyroidism   . Knee pain, left     Patient Active Problem List   Diagnosis Date Noted  . Calculus of gallbladder without cholecystitis without obstruction   . Chronic back pain 03/13/2015  . Hypothyroidism 03/13/2015  . Hypokalemia 03/13/2015  . Abdominal wall cellulitis 03/13/2015  . Bilateral lower extremity edema 03/13/2015    Past Surgical History:  Procedure Laterality Date  . CHOLECYSTECTOMY N/A 03/26/2018   Procedure: LAPAROSCOPIC CHOLECYSTECTOMY;  Surgeon: Leafy RoPabon, Diego F, MD;  Location: [facility] ORS;  Service: General;  Laterality: N/A;  . ESOPHAGOGASTRODUODENOSCOPY (EGD) WITH PROPOFOL N/A 04/16/2018   Procedure: ESOPHAGOGASTRODUODENOSCOPY (EGD) WITH PROPOFOL;  Surgeon: Wyline MoodAnna, Kiran, MD;  Location: [facility] ENDOSCOPY;  Service: Gastroenterology;  Laterality: N/A;  . ESOPHAGOGASTRODUODENOSCOPY (EGD) WITH PROPOFOL N/A 04/23/2018   Procedure: ESOPHAGOGASTRODUODENOSCOPY (EGD) WITH PROPOFOL;  Surgeon: Wyline MoodAnna, Kiran, MD;  [facility] ENDOSCOPY;  Service: Gastroenterology;  Laterality: N/A;  . KNEE SURGERY Left     Prior to  Admission medications   Medication Sig Start Date End Date Taking? Authorizing Provider  chlorpheniramine-HYDROcodone (TUSSIONEX PENNKINETIC ER) 10-8 MG/5ML SUER Take 5 mLs by mouth 2 (two) times daily. 05/03/18   Irean HongSung, Jade J, MD  cyclobenzaprine (FLEXERIL) 10 MG tablet Take 10 mg by mouth 3 (three) times daily.    [provider]  dicyclomine (BENTYL) 10 MG capsule TAKE 1 CAPSULE (10 MG TOTAL) BY MOUTH 4 (FOUR) TIMES DAILY - BEFORE MEALS AND AT BEDTIME. 05/11/18   Wyline MoodAnna, Kiran, MD  furosemide (LASIX) 40 MG tablet Take 40 mg by mouth 3 (three) times daily.     [provider]  lidocaine (XYLOCAINE) 2 % solution Use as directed 15 mLs in the mouth or throat every 6 (six) hours as needed (abdominal pain). 08/10/18   Wyline MoodAnna, Kiran, MD  lisinopril (PRINIVIL,ZESTRIL) 10 MG tablet Take 10 mg by mouth daily.    [provider]  methadone (DOLOPHINE) 10 MG tablet Take 10 mg by mouth every 12 (twelve) hours.     [provider]  metoCLOPramide (REGLAN) 10 MG tablet Take 1 tablet (10 mg total) by mouth every 8 (eight) hours as needed for nausea. 05/03/18   Irean HongSung, Jade J, MD  omeprazole (PRILOSEC) 20 MG capsule Take 20 mg by mouth 2 (two) times daily before a meal.    [provider]  ondansetron (ZOFRAN ODT) 4 MG disintegrating tablet Take 1 tablet (4 mg total) by mouth every 8 (eight) hours as needed for nausea or vomiting (take every 6 hours for nausea.). 04/15/18   Pabon, Diego F, MD  sucralfate (CARAFATE) 1 g tablet  Take 1 tablet (1 g total) by mouth 4 (four) times daily. 08/10/18 10/09/18  Wyline MoodAnna, Kiran, MD    Allergies Vancomycin  Family History  Problem Relation Age of Onset  . Diabetes Other   . CAD Other   . Stroke Mother   . Learning disabilities Mother   . Lung cancer Mother   . Heart disease Father     Social History Social History   Tobacco Use  . Smoking status: Never Smoker  . Smokeless tobacco: Never Used  Substance Use Topics  . Alcohol use:  No    Alcohol/week: 0.0 standard drinks  . Drug use: Never    Review of Systems Constitutional: No fever/chills Eyes: No visual changes. ENT: No sore throat. Cardiovascular: Denies chest pain. Respiratory: Denies shortness of breath. Gastrointestinal: Positive for abdominal pain and vomiting no diarrhea.  No constipation. Genitourinary: Negative for dysuria. Musculoskeletal: Negative for neck pain.  Negative for back pain. Integumentary: Negative for rash. Neurological: Negative for headaches, focal weakness or numbness.   ____________________________________________   PHYSICAL EXAM:  VITAL SIGNS: ED Triage Vitals  Enc Vitals Group     BP 08/11/18 0107 132/85     Pulse Rate 08/11/18 0107 (!) 110     Resp 08/11/18 0107 18     Temp 08/11/18 0107 98.4 F (36.9 C)     Temp Source 08/11/18 0107 Oral     SpO2 08/11/18 0107 96 %     Weight 08/11/18 0108 121.1 kg (267 lb)     Height 08/11/18 0108 1.753 m (5\' 9" )     Head Circumference --      Peak Flow --      Pain Score 08/11/18 0108 9     Pain Loc --      Pain Edu? --      Excl. in GC? --     Constitutional: Alert and oriented.  Apparent discomfort.   Eyes: Conjunctivae are normal. Mouth/Throat: Mucous membranes are moist.  Oropharynx non-erythematous. Neck: No stridor.   Cardiovascular: Normal rate, regular rhythm. Good peripheral circulation. Grossly normal heart sounds. Respiratory: Normal respiratory effort.  No retractions. Lungs CTAB. Gastrointestinal: Epigastric and left upper quadrant tenderness to palpation.  No distention.  Musculoskeletal: No lower extremity tenderness nor edema. No gross deformities of extremities. Neurologic:  Normal speech and language. No gross focal neurologic deficits are appreciated.  Skin:  Skin is warm, dry and intact. No rash noted.   ____________________________________________   LABS (all labs ordered are listed, but only abnormal results are displayed)  Labs Reviewed    COMPREHENSIVE METABOLIC PANEL - Abnormal; Notable for the following components:      Result Value   Glucose, Bld 122 (*)    All other components within normal limits  CBC - Abnormal; Notable for the following components:   WBC 14.3 (*)    MCV 79.2 (*)    MCH 25.5 (*)    Platelets 428 (*)    All other components within normal limits  URINALYSIS, COMPLETE (UACMP) WITH MICROSCOPIC - Abnormal; Notable for the following components:   Color, Urine YELLOW (*)    APPearance CLEAR (*)    Leukocytes, UA TRACE (*)    Bacteria, UA RARE (*)    All other components within normal limits  LIPASE, BLOOD  POC URINE PREG, ED   ____________________________  RADIOLOGY I, [place] BROWN, personally viewed and evaluated these images (plain radiographs) as part of my medical decision making, as well as reviewing the written  report by the radiologist.  ED MD interpretation: No acute findings noted on CT abdomen pelvis.  CLINICAL DATA:  Generalized acute abdominal pain. Additional history of left upper quadrant pain.  EXAM: CT ABDOMEN AND PELVIS WITH CONTRAST  TECHNIQUE: Multidetector CT imaging of the abdomen and pelvis was performed using the standard protocol following bolus administration of intravenous contrast.  CONTRAST:  ISOVUE-300 IOPAMIDOL (ISOVUE-300) INJECTION 61%  COMPARISON:  04/14/2018  FINDINGS: Lower chest:  No contributory findings.  Hepatobiliary: No focal liver abnormality.Cholecystectomy without bile duct dilatation.  Pancreas: Generalized atrophic appearance for age with greater fatty infiltration into the pancreatic head.  Spleen: Unremarkable.  Adrenals/Urinary Tract: Negative adrenals. No hydronephrosis or stone. On early phase the upper pole right kidney appears to be less enhancing than the left, but this does not persist on the delayed phase. No striated nephrogram. Unremarkable bladder.  Stomach/Bowel: No obstruction. No appendicitis.  Formed stool throughout the colon.  Vascular/Lymphatic: No acute vascular abnormality. No mass or adenopathy.  Reproductive:Intramural posterior uterine masses consistent with fibroids. Central cavitation/non enhancement is unchanged from prior patient's abdominal pain is in the upper abdomen per report.  Other: No ascites or pneumoperitoneum.  Musculoskeletal: No acute abnormalities. Prominent L4-5 facet hypertrophy encroaching on the spinal canal  IMPRESSION: 1. No acute finding. 2. Generalized colonic stool. 3. Fibroids.   Procedures   ____________________________________________   INITIAL IMPRESSION / ASSESSMENT AND PLAN / ED COURSE  As part of my medical decision making, I reviewed the following data within the electronic MEDICAL RECORD NUMBER 48 year old female present with above-stated history and physical exam secondary to abdominal pain with no clear etiology identified.  Laboratory data unremarkable.  Patient referred to Dr. Tobi Bastos her gastroenterologist for further outpatient evaluation. _______________________________  FINAL CLINICAL IMPRESSION(S) / ED DIAGNOSES  Final diagnoses:  Epigastric pain     MEDICATIONS GIVEN DURING THIS VISIT:  Medications  morphine 2 MG/ML injection 2 mg (2 mg Intravenous Given 08/11/18 0410)  ondansetron (ZOFRAN) injection 4 mg (4 mg Intravenous Given 08/11/18 0408)     ED Discharge Orders    None       Note:  This document was prepared using Dragon voice recognition software and may include unintentional dictation errors.    Darci Current, MD 08/12/18 [phone]

## 2018-08-11 NOTE — ED Notes (Signed)
EDP in with patient with this RN.

## 2018-08-11 NOTE — ED Notes (Signed)
Patient transported to CT 

## 2018-08-11 NOTE — ED Notes (Signed)
Patient state hurting in her stomach since she had gallbladder surgery.  Patient is tender on left upper quadrant. Patient states pain is 9/10.

## 2018-08-20 ENCOUNTER — Ambulatory Visit
Admission: RE | Admit: 2018-08-20 | Discharge: 2018-08-20 | Disposition: A | Discharge status: Home / Self Care | Payer: Medicare Other | Source: Ambulatory Visit | Attending: Gastroenterology | Admitting: Gastroenterology

## 2018-08-20 ENCOUNTER — Other Ambulatory Visit: Payer: Self-pay | Admitting: Gastroenterology

## 2018-08-20 DIAGNOSIS — K30 Functional dyspepsia: Secondary | ICD-10-CM | POA: Insufficient documentation

## 2018-08-20 DIAGNOSIS — R1013 Epigastric pain: Secondary | ICD-10-CM

## 2018-08-20 MED ORDER — GADOBUTROL 1 MMOL/ML IV SOLN
10.0000 mL | Freq: Once | INTRAVENOUS | Status: AC | PRN
  Administered 2018-08-20: 10 mL via INTRAVENOUS

## 2018-08-24 ENCOUNTER — Encounter: Payer: Self-pay | Admitting: Gastroenterology

## 2018-08-25 ENCOUNTER — Ambulatory Visit (INDEPENDENT_AMBULATORY_CARE_PROVIDER_SITE_OTHER): Payer: Medicare Other | Admitting: Gastroenterology

## 2018-08-25 ENCOUNTER — Other Ambulatory Visit: Payer: Self-pay

## 2018-08-25 ENCOUNTER — Encounter: Payer: Self-pay | Admitting: Gastroenterology

## 2018-08-25 VITALS — BP 113/81 | HR 98 | Ht 69.0 in | Wt 269.8 lb

## 2018-08-25 DIAGNOSIS — K30 Functional dyspepsia: Secondary | ICD-10-CM | POA: Diagnosis not present

## 2018-08-25 NOTE — Progress Notes (Signed)
[name] MD, MRCP(U.K) [address]  [address]  [place], [place] [address]  Main: [phone]  Fax: [phone]   Primary Care Physician: Dortha Kern, MD  Primary Gastroenterologist:  Dr. [name]   Chief Complaint  Patient presents with  . Follow-up    Epigastric pain    HPI: Amy Brown is a 48 y.o. female   Summary of history :  She has been initially referred and seen in 04/2018 for abdominal pain . She presented in 04/2018 to the ER. She underwent a cholecystectomy in Sept 2019 . 3 days after surgery she had pain similar to what she had prior to surgery . Sent home on PPI yesterday .  CT abdomen and CT angio chest showed no gross abnormality - focal pancreatitis could not be ruled out. Bacteria seen in urine. Hb 12.1 gramsliver function tests's ,lipase normal.   04/23/18 : EGD: Normal : mild chronic gastritis on bx.  04/15/18 : H pylori breath test negative.  05/10/18 : Gastric emptying study normal.  05/02/18 : Seen at the ER on  for RUQ Pain.  05/18/2018 : RUQ USG showed fatty changes normal common bile duct,  07/31/2018 : ER visit for abdominal pain : Given GI cocktail and discharged.   07/31/2018 : Lipase and CMP showed no significant abnormality.   She has been having abdominal pain since 12/2017 , continuous , epigastric, localized, burning and pressure in description. Constipation with no relief of pain after a bowel movement.    Interval history  08/10/2018-08/25/2018  08/20/2018: MRCP: fatty atrophy of the pancreas, no obvious filling defects of CBD.   08/11/2018: LFT's normal  Still has post prandial pain but usually present all the time. Epigastric, no better after a bowel movement , worse after eating , stress makes it worse. Bloating and abdominal distension +, on PPI,carafate . Did not tolerate IB guard.    Current Outpatient Medications  Medication Sig Dispense Refill  . furosemide (LASIX) 40 MG tablet Take 40 mg by mouth 3 (three)  times daily.     Marland Kitchen lisinopril (PRINIVIL,ZESTRIL) 10 MG tablet Take 10 mg by mouth daily.    . methadone (DOLOPHINE) 10 MG tablet Take 10 mg by mouth every 12 (twelve) hours.     Marland Kitchen omeprazole (PRILOSEC) 20 MG capsule Take 20 mg by mouth 2 (two) times daily before a meal.    . sucralfate (CARAFATE) 1 g tablet Take 1 tablet (1 g total) by mouth 4 (four) times daily. 240 tablet 0  . chlorpheniramine-HYDROcodone (TUSSIONEX PENNKINETIC ER) 10-8 MG/5ML SUER Take 5 mLs by mouth 2 (two) times daily. (Patient not taking: Reported on 08/25/2018) 70 mL 0  . cyclobenzaprine (FLEXERIL) 10 MG tablet Take 10 mg by mouth 3 (three) times daily.    Marland Kitchen dicyclomine (BENTYL) 10 MG capsule TAKE 1 CAPSULE (10 MG TOTAL) BY MOUTH 4 (FOUR) TIMES DAILY - BEFORE MEALS AND AT BEDTIME. (Patient not taking: Reported on 08/25/2018) 90 capsule 0  . lidocaine (XYLOCAINE) 2 % solution Use as directed 15 mLs in the mouth or throat every 6 (six) hours as needed (abdominal pain). (Patient not taking: Reported on 08/25/2018) 100 mL 0  . metoCLOPramide (REGLAN) 10 MG tablet Take 1 tablet (10 mg total) by mouth every 8 (eight) hours as needed for nausea. (Patient not taking: Reported on 08/25/2018) 20 tablet 0  . ondansetron (ZOFRAN ODT) 4 MG disintegrating tablet Take 1 tablet (4 mg total) by mouth every 8 (eight) hours  as needed for nausea or vomiting (take every 6 hours for nausea.). (Patient not taking: Reported on 08/25/2018) 20 tablet 0   No current facility-administered medications for this visit.     Allergies as of 08/25/2018 - Review Complete 08/25/2018  Allergen Reaction Noted  . Vancomycin Hives and Other (See Comments) 03/13/2015    ROS:  General: Negative for anorexia, weight loss, fever, chills, fatigue, weakness. ENT: Negative for hoarseness, difficulty swallowing , nasal congestion. CV: Negative for chest pain, angina, palpitations, dyspnea on exertion, peripheral edema.  Respiratory: Negative for dyspnea at rest, dyspnea  on exertion, cough, sputum, wheezing.  GI: See history of present illness. GU:  Negative for dysuria, hematuria, urinary incontinence, urinary frequency, nocturnal urination.  Endo: Negative for unusual weight change.    Physical Examination:   BP 113/81   Pulse 98   Ht 5\' 9"  (1.753 m)   Wt 269 lb 12.8 oz (122.4 kg)   LMP 08/03/2018   BMI 39.84 kg/m   General: Well-nourished, well-developed in no acute distress.  Eyes: No icterus. Conjunctivae pink. Mouth: Oropharyngeal mucosa moist and pink , no lesions erythema or exudate. Lungs: Clear to auscultation bilaterally. Non-labored. Heart: Regular rate and rhythm, no murmurs rubs or gallops.  Abdomen: Bowel sounds are normal, nontender, nondistended, no hepatosplenomegaly or masses, no abdominal bruits or hernia , no rebound or guarding.   Extremities: No lower extremity edema. No clubbing or deformities. Neuro: Alert and oriented x 3.  Grossly intact. Skin: Warm and dry, no jaundice.   Psych: Alert and cooperative, normal mood and affect.   Imaging Studies: Ct Abdomen Pelvis W Contrast  Result Date: 08/11/2018 CLINICAL DATA:  Generalized acute abdominal pain. Additional history of left upper quadrant pain. EXAM: CT ABDOMEN AND PELVIS WITH CONTRAST TECHNIQUE: Multidetector CT imaging of the abdomen and pelvis was performed using the standard protocol following bolus administration of intravenous contrast. CONTRAST:  100mL ISOVUE-300 IOPAMIDOL (ISOVUE-300) INJECTION 61% COMPARISON:  04/14/2018 FINDINGS: Lower chest:  No contributory findings. Hepatobiliary: No focal liver abnormality.Cholecystectomy without bile duct dilatation. Pancreas: Generalized atrophic appearance for age with greater fatty infiltration into the pancreatic head. Spleen: Unremarkable. Adrenals/Urinary Tract: Negative adrenals. No hydronephrosis or stone. On early phase the upper pole right kidney appears to be less enhancing than the left, but this does not persist on  the delayed phase. No striated nephrogram. Unremarkable bladder. Stomach/Bowel: No obstruction. No appendicitis. Formed stool throughout the colon. Vascular/Lymphatic: No acute vascular abnormality. No mass or adenopathy. Reproductive:Intramural posterior uterine masses consistent with fibroids. Central cavitation/non enhancement is unchanged from prior patient's abdominal pain is in the upper abdomen per report. Other: No ascites or pneumoperitoneum. Musculoskeletal: No acute abnormalities. Prominent L4-5 facet hypertrophy encroaching on the spinal canal IMPRESSION: 1. No acute finding. 2. Generalized colonic stool. 3. Fibroids. Electronically Signed   By: Marnee SpringJonathon  Watts M.D.   On: 08/11/2018 05:23   Mr 3d Recon At Scanner  Result Date: 08/20/2018 CLINICAL DATA:  Epigastric pain EXAM: MRI ABDOMEN WITHOUT AND WITH CONTRAST (INCLUDING MRCP) TECHNIQUE: Multiplanar multisequence MR imaging of the abdomen was performed both before and after the administration of intravenous contrast. Heavily T2-weighted images of the biliary and pancreatic ducts were obtained, and three-dimensional MRCP images were rendered by post processing. CONTRAST:  10 cc Gadavist COMPARISON:  CT abdomen 08/11/2018 FINDINGS: Lower chest: Unremarkable Hepatobiliary: Cholecystectomy. Some images such as image 22/16 and image 7/a raise the possibility of a small filling defect in the distal common bile duct, but other sequences do  not show this appearance, and the biliary tree is not dilated. No enhancement along the bile ducts. No focal hepatic parenchymal lesion. Pancreas: As on prior exams, there appears to be focal atrophy and accentuated fatty stippling in the pancreatic head, without appreciable tumor. Spleen:  Unremarkable Adrenals/Urinary Tract:  Unremarkable Stomach/Bowel: Unremarkable Vascular/Lymphatic:  Unremarkable Other:  No supplemental non-categorized findings. Musculoskeletal: Unremarkable IMPRESSION: 1. A cause for the patient's  epigastric pain is not identified. Questionable filling defect in the CBD is not confirmed on other imaging sequences and is likely artifact. 2. As on prior exams, there appears to be some fatty atrophy of the pancreatic head. Electronically Signed   By: Gaylyn Rong M.D.   On: 08/20/2018 14:13   Mr Abdomen Mrcp Vivien Rossetti Contast  Result Date: 08/20/2018 CLINICAL DATA:  Epigastric pain EXAM: MRI ABDOMEN WITHOUT AND WITH CONTRAST (INCLUDING MRCP) TECHNIQUE: Multiplanar multisequence MR imaging of the abdomen was performed both before and after the administration of intravenous contrast. Heavily T2-weighted images of the biliary and pancreatic ducts were obtained, and three-dimensional MRCP images were rendered by post processing. CONTRAST:  10 cc Gadavist COMPARISON:  CT abdomen 08/11/2018 FINDINGS: Lower chest: Unremarkable Hepatobiliary: Cholecystectomy. Some images such as image 22/16 and image 7/a raise the possibility of a small filling defect in the distal common bile duct, but other sequences do not show this appearance, and the biliary tree is not dilated. No enhancement along the bile ducts. No focal hepatic parenchymal lesion. Pancreas: As on prior exams, there appears to be focal atrophy and accentuated fatty stippling in the pancreatic head, without appreciable tumor. Spleen:  Unremarkable Adrenals/Urinary Tract:  Unremarkable Stomach/Bowel: Unremarkable Vascular/Lymphatic:  Unremarkable Other:  No supplemental non-categorized findings. Musculoskeletal: Unremarkable IMPRESSION: 1. A cause for the patient's epigastric pain is not identified. Questionable filling defect in the CBD is not confirmed on other imaging sequences and is likely artifact. 2. As on prior exams, there appears to be some fatty atrophy of the pancreatic head. Electronically Signed   By: Gaylyn Rong M.D.   On: 08/20/2018 14:13    Assessment and Plan:   TEANDREA CUBERO is a 48 y.o. y/o female here to follow up for   epigastric pain since may 2019 - not better with double dose PPI. S/p cholecystectomy. ?fatty pancreas on CT scan/MRCP . No clear etiology for the abdominal pain. 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   Plan  1. Continue PPI 2. SIBO breath testing  3. Trial of CREON and if does not work try Probiotics 4. Renforced nature of functional dyspepsia,relation to stress, mind over symptoms, few cures available and no pathology on testing extensively so far, also offered second opinion at [facility] or [facility] if needed   Dr [name]  MD,MRCP [facility]) Follow up in 4 weeks

## 2018-09-04 ENCOUNTER — Telehealth: Payer: Self-pay

## 2018-09-04 ENCOUNTER — Emergency Department: Payer: Medicare Other

## 2018-09-04 ENCOUNTER — Emergency Department
Admission: EM | Admit: 2018-09-04 | Discharge: 2018-09-04 | Disposition: A | Discharge status: Home / Self Care | Payer: Medicare Other | Attending: Emergency Medicine | Admitting: Emergency Medicine

## 2018-09-04 ENCOUNTER — Other Ambulatory Visit: Payer: Self-pay

## 2018-09-04 DIAGNOSIS — J45909 Unspecified asthma, uncomplicated: Secondary | ICD-10-CM | POA: Insufficient documentation

## 2018-09-04 DIAGNOSIS — E039 Hypothyroidism, unspecified: Secondary | ICD-10-CM | POA: Diagnosis not present

## 2018-09-04 DIAGNOSIS — L03211 Cellulitis of face: Secondary | ICD-10-CM | POA: Insufficient documentation

## 2018-09-04 DIAGNOSIS — Z23 Encounter for immunization: Secondary | ICD-10-CM | POA: Diagnosis not present

## 2018-09-04 DIAGNOSIS — Z79899 Other long term (current) drug therapy: Secondary | ICD-10-CM | POA: Insufficient documentation

## 2018-09-04 DIAGNOSIS — I10 Essential (primary) hypertension: Secondary | ICD-10-CM | POA: Diagnosis not present

## 2018-09-04 DIAGNOSIS — R600 Localized edema: Secondary | ICD-10-CM | POA: Diagnosis present

## 2018-09-04 LAB — COMPREHENSIVE METABOLIC PANEL
ALT: 21 U/L (ref 0–44)
AST: 19 U/L (ref 15–41)
Albumin: 3.5 g/dL (ref 3.5–5.0)
Alkaline Phosphatase: 92 U/L (ref 38–126)
Anion gap: 10 (ref 5–15)
BUN: 10 mg/dL (ref 6–20)
CO2: 26 mmol/L (ref 22–32)
Calcium: 9 mg/dL (ref 8.9–10.3)
Chloride: 101 mmol/L (ref 98–111)
Creatinine, Ser: 0.64 mg/dL (ref 0.44–1.00)
GFR calc Af Amer: >60 mL/min (ref >60)
GFR calc non Af Amer: >60 mL/min (ref >60)
Glucose, Bld: 120 mg/dL — ABNORMAL HIGH (ref 70–99)
Potassium: 3.4 mmol/L — ABNORMAL LOW (ref 3.5–5.1)
Sodium: 137 mmol/L (ref 135–145)
Total Bilirubin: 0.7 mg/dL (ref 0.3–1.2)
Total Protein: 7 g/dL (ref 6.5–8.1)

## 2018-09-04 LAB — CBC WITH DIFFERENTIAL/PLATELET
ABS IMMATURE GRANULOCYTES: 0.04 10*3/uL (ref 0.00–0.07)
Basophils Absolute: 0.1 10*3/uL (ref 0.0–0.1)
Basophils Relative: 0 %
Eosinophils Absolute: 0.2 10*3/uL (ref 0.0–0.5)
Eosinophils Relative: 2 %
HCT: 37.6 % (ref 36.0–46.0)
HEMOGLOBIN: 12 g/dL (ref 12.0–15.0)
IMMATURE GRANULOCYTES: 0 %
Lymphocytes Relative: 18 %
Lymphs Abs: 2 10*3/uL (ref 0.7–4.0)
MCH: 25.3 pg — ABNORMAL LOW (ref 26.0–34.0)
MCHC: 31.9 g/dL (ref 30.0–36.0)
MCV: 79.3 fL — ABNORMAL LOW (ref 80.0–100.0)
Monocytes Absolute: 0.8 10*3/uL (ref 0.1–1.0)
Monocytes Relative: 7 %
NEUTROS ABS: 8.2 10*3/uL — AB (ref 1.7–7.7)
Neutrophils Relative %: 73 %
Platelets: 423 10*3/uL — ABNORMAL HIGH (ref 150–400)
RBC: 4.74 MIL/uL (ref 3.87–5.11)
RDW: 15.5 % (ref 11.5–15.5)
WBC: 11.4 10*3/uL — ABNORMAL HIGH (ref 4.0–10.5)
nRBC: 0 % (ref 0.0–0.2)

## 2018-09-04 LAB — URINALYSIS, COMPLETE (UACMP) WITH MICROSCOPIC
Bacteria, UA: NONE SEEN
Bilirubin Urine: NEGATIVE
GLUCOSE, UA: NEGATIVE mg/dL
Hgb urine dipstick: NEGATIVE
KETONES UR: NEGATIVE mg/dL
Nitrite: NEGATIVE
Protein, ur: NEGATIVE mg/dL
Specific Gravity, Urine: 1.02 (ref 1.005–1.030)
pH: 7 (ref 5.0–8.0)

## 2018-09-04 LAB — LACTIC ACID, PLASMA: Lactic Acid, Venous: 1.1 mmol/L (ref 0.5–1.9)

## 2018-09-04 LAB — POCT PREGNANCY, URINE: Preg Test, Ur: NEGATIVE

## 2018-09-04 MED ORDER — DEXAMETHASONE SODIUM PHOSPHATE 10 MG/ML IJ SOLN
10.0000 mg | Freq: Once | INTRAMUSCULAR | Status: AC
  Administered 2018-09-04: 10 mg via INTRAVENOUS
  Filled 2018-09-04: qty 1

## 2018-09-04 MED ORDER — CEPHALEXIN 500 MG PO CAPS
500.0000 mg | ORAL_CAPSULE | Freq: Once | ORAL | Status: AC
  Administered 2018-09-04: 500 mg via ORAL
  Filled 2018-09-04: qty 1

## 2018-09-04 MED ORDER — SULFAMETHOXAZOLE-TRIMETHOPRIM 800-160 MG PO TABS
1.0000 | ORAL_TABLET | Freq: Once | ORAL | Status: AC
  Administered 2018-09-04: 1 via ORAL
  Filled 2018-09-04: qty 1

## 2018-09-04 MED ORDER — SODIUM CHLORIDE 0.9% FLUSH: 3.0000 mL | Freq: Once | INTRAVENOUS | Status: DC

## 2018-09-04 MED ORDER — CEPHALEXIN 500 MG PO CAPS: 500.0000 mg | ORAL_CAPSULE | Freq: Three times a day (TID) | ORAL | 0 refills | Status: AC

## 2018-09-04 MED ORDER — TETANUS-DIPHTH-ACELL PERTUSSIS 5-2.5-18.5 LF-MCG/0.5 IM SUSP
0.5000 mL | Freq: Once | INTRAMUSCULAR | Status: AC
  Administered 2018-09-04: 0.5 mL via INTRAMUSCULAR
  Filled 2018-09-04: qty 0.5

## 2018-09-04 MED ORDER — IOHEXOL 300 MG/ML  SOLN
75.0000 mL | Freq: Once | INTRAMUSCULAR | Status: AC | PRN
  Administered 2018-09-04: 75 mL via INTRAVENOUS

## 2018-09-04 MED ORDER — KETOROLAC TROMETHAMINE 30 MG/ML IJ SOLN
15.0000 mg | Freq: Once | INTRAMUSCULAR | Status: AC
  Administered 2018-09-04: 15 mg via INTRAVENOUS
  Filled 2018-09-04: qty 1

## 2018-09-04 MED ORDER — SULFAMETHOXAZOLE-TRIMETHOPRIM 800-160 MG PO TABS: 1.0000 | ORAL_TABLET | Freq: Two times a day (BID) | ORAL | 0 refills | Status: AC

## 2018-09-04 NOTE — ED Notes (Signed)
Pt states she thinks her face is more swollen since MD has reassessed. MD notified and in to see pt again.

## 2018-09-04 NOTE — Telephone Encounter (Signed)
Pt calls stating that she started her Creon on 08/20/18 and 3 days later she started with symptoms of eyes watering, feeling of glands behind ears draining (no visible drainage), eyes swollen and one of the eyelids almost shut from swelling (most recent). These symptoms have been worsening with time. Took Creon last night. Denies any swelling of tongue, chest pain but has difficulty breathing "sometimes". Denies any contact with any outdoor allergens or changes in soap, etc. I advised pt to stop Creon at this time and go to an urgent care or ER. She wanted to know if she needed to start the other medication Dr. Tobi Bastos suggested if Creon did not work and I suggested she hold off until we see what is causing her symptoms and Dr. Tobi Bastos approves.

## 2018-09-04 NOTE — ED Notes (Signed)
Pt is NAD ambulatory and EDP at bedside. Pt presents with R/S Facial Swelling

## 2018-09-04 NOTE — ED Triage Notes (Signed)
Pt c/o waking with swelling of the face, more on the right side . Pt has a red raised area at the hairline that has been there for the past week. States a lot of puss came out of it last night.

## 2018-09-04 NOTE — Telephone Encounter (Signed)
errror

## 2018-09-04 NOTE — ED Notes (Signed)
Pt reports some improvement in facial pain. No new swelling noted.

## 2018-09-04 NOTE — ED Provider Notes (Addendum)
[facility] Emergency Department Provider Note  ____________________________________________  Time seen: Approximately 6:41 PM  I have reviewed the triage vital signs and the nursing notes.   HISTORY  Chief Complaint Abscess and Facial Swelling    HPI Amy Brown is a 48 y.o. female with a history of asthma, hypertension, hypothyroidism who complains of swelling of the top of her forehead in the middle of her face with some scant purulent drainage this morning.  She reports she gets boils there frequently, and several days ago she hit that part of her head on a tree branch and a metal fence causing contusion to the area without laceration.  Over the last 24 hours she has developed swelling of the bilateral face at the angles of the jaw and feels pain in her ears.  She also has swelling around both eyes, right greater than left.  Denies vision change paresthesias or weakness.  Unsure when her last tetanus shot was.  Denies spasticity muscle cramps or seizures.  No known tick exposures or spider bites.  She does have a history of MRSA infection   Past Medical History:  Diagnosis Date  . Asthma   . Chronic back pain   . Hypertension   . Hypothyroidism   . Knee pain, left      Patient Active Problem List   Diagnosis Date Noted  . Calculus of gallbladder without cholecystitis without obstruction   . Chronic back pain 03/13/2015  . Hypothyroidism 03/13/2015  . Hypokalemia 03/13/2015  . Abdominal wall cellulitis 03/13/2015  . Bilateral lower extremity edema 03/13/2015     Past Surgical History:  Procedure Laterality Date  . CHOLECYSTECTOMY N/A 03/26/2018   Procedure: LAPAROSCOPIC CHOLECYSTECTOMY;  Surgeon: Leafy Ro, MD;  Location: [facility] ORS;  Service: General;  Laterality: N/A;  . ESOPHAGOGASTRODUODENOSCOPY (EGD) WITH PROPOFOL N/A 04/16/2018   Procedure: ESOPHAGOGASTRODUODENOSCOPY (EGD) WITH PROPOFOL;  Surgeon: [name], MD;  Location: [facility]  ENDOSCOPY;  Service: Gastroenterology;  Laterality: N/A;  . ESOPHAGOGASTRODUODENOSCOPY (EGD) WITH PROPOFOL N/A 04/23/2018   Procedure: ESOPHAGOGASTRODUODENOSCOPY (EGD) WITH PROPOFOL;  Surgeon: [name], MD;  Location: [facility] ENDOSCOPY;  Service: Gastroenterology;  Laterality: N/A;  . KNEE SURGERY Left      Prior to Admission medications   Medication Sig Start Date End Date Taking? Authorizing Provider  cephALEXin (KEFLEX) 500 MG capsule Take 1 capsule (500 mg total) by mouth 3 (three) times daily. 09/04/18   Sharman Cheek, MD  chlorpheniramine-HYDROcodone [facility] ER) 10-8 MG/5ML SUER Take 5 mLs by mouth 2 (two) times daily. Patient not taking: Reported on 08/25/2018 05/03/18   Irean Hong, MD  cyclobenzaprine (FLEXERIL) 10 MG tablet Take 10 mg by mouth 3 (three) times daily.    [provider]  dicyclomine (BENTYL) 10 MG capsule TAKE 1 CAPSULE (10 MG TOTAL) BY MOUTH 4 (FOUR) TIMES DAILY - BEFORE MEALS AND AT BEDTIME. Patient not taking: Reported on 08/25/2018 05/11/18   [name], MD  furosemide (LASIX) 40 MG tablet Take 40 mg by mouth 3 (three) times daily.     [provider]  lidocaine (XYLOCAINE) 2 % solution Use as directed 15 mLs in the mouth or throat every 6 (six) hours as needed (abdominal pain). Patient not taking: Reported on 08/25/2018 08/10/18   [name], MD  lisinopril (PRINIVIL,ZESTRIL) 10 MG tablet Take 10 mg by mouth daily.    [provider]  methadone (DOLOPHINE) 10 MG tablet Take 10 mg by mouth every 12 (twelve) hours.  [provider]  metoCLOPramide (REGLAN) 10 MG tablet Take 1 tablet (10 mg total) by mouth every 8 (eight) hours as needed for nausea. Patient not taking: Reported on 08/25/2018 05/03/18   Irean Hong, MD  omeprazole (PRILOSEC) 20 MG capsule Take 20 mg by mouth 2 (two) times daily before a meal.    [provider]  ondansetron (ZOFRAN ODT) 4 MG disintegrating tablet Take 1 tablet (4 mg total)  by mouth every 8 (eight) hours as needed for nausea or vomiting (take every 6 hours for nausea.). Patient not taking: Reported on 08/25/2018 04/15/18   Sterling Big F, MD  sucralfate (CARAFATE) 1 g tablet Take 1 tablet (1 g total) by mouth 4 (four) times daily. 08/10/18 10/09/18  [name], MD  sulfamethoxazole-trimethoprim (BACTRIM DS) 800-160 MG tablet Take 1 tablet by mouth 2 (two) times daily. 09/04/18   Sharman Cheek, MD     Allergies Vancomycin and Benadryl [diphenhydramine]   Family History  Problem Relation Age of Onset  . Diabetes Other   . CAD Other   . Stroke Mother   . Learning disabilities Mother   . Lung cancer Mother   . Heart disease Father     Social History Social History   Tobacco Use  . Smoking status: Never Smoker  . Smokeless tobacco: Never Used  Substance Use Topics  . Alcohol use: No    Alcohol/week: 0.0 standard drinks  . Drug use: Never    Review of Systems  Constitutional:   No fever or chills.  ENT:   No sore throat. No rhinorrhea. Cardiovascular:   No chest pain or syncope. Respiratory:   No dyspnea or cough. Gastrointestinal:   Negative for abdominal pain, vomiting and diarrhea.  Musculoskeletal:   Positive facial swelling as above All other systems reviewed and are negative except as documented above in ROS and HPI.  ____________________________________________   PHYSICAL EXAM:  VITAL SIGNS: ED Triage Vitals  Enc Vitals Group     BP 09/04/18 1431 (!) 155/85     Pulse Rate 09/04/18 1431 94     Resp 09/04/18 1431 17     Temp 09/04/18 1431 98.8 F (37.1 C)     Temp Source 09/04/18 1431 Oral     SpO2 09/04/18 1431 97 %     Weight 09/04/18 1432 240 lb (108.9 kg)     Height 09/04/18 1432  (1.753 m)     Head Circumference --      Peak Flow --      Pain Score 09/04/18 1432 7     Pain Loc --      Pain Edu? --      Excl. in GC? --     Vital signs reviewed, nursing assessments reviewed.   Constitutional:   Alert and  oriented. Non-toxic appearance. Eyes:   Conjunctivae are normal. EOMI. PERRL.  Soft edema of bilateral eyelids, right greater than left.  Lids everted, no foreign bodies ENT      Head:   Normocephalic and atraumatic.  TMs and external canals normal      Nose:   No congestion/rhinnorhea.       Mouth/Throat:   MMM, no pharyngeal erythema. No peritonsillar mass.       Neck:   No meningismus. Full ROM. Hematological/Lymphatic/Immunilogical:   Positive bilateral small cervical lymphadenopathy. Cardiovascular:   RRR. Symmetric bilateral radial and DP pulses.  No murmurs. Cap refill less than 2 seconds. Respiratory:   Normal respiratory effort without  tachypnea/retractions. Breath sounds are clear and equal bilaterally. No wheezes/rales/rhonchi. Gastrointestinal:   Soft and nontender. Non distended. There is no CVA tenderness.  No rebound, rigidity, or guarding. Musculoskeletal:   Normal range of motion in all extremities. No joint effusions.  No lower extremity tenderness.  No edema. Neurologic:   Normal speech and language.  Motor grossly intact. No acute focal neurologic deficits are appreciated.  Skin:    Skin is warm, dry and intact.  Splotchy erythematous rash over bilateral neck and upper chest, not raised.  Blanches.  Nontender.  ____________________________________________    LABS (pertinent positives/negatives) (all labs ordered are listed, but only abnormal results are displayed) Labs Reviewed  COMPREHENSIVE METABOLIC PANEL - Abnormal; Notable for the following components:      Result Value   Potassium 3.4 (*)    Glucose, Bld 120 (*)    All other components within normal limits  CBC WITH DIFFERENTIAL/PLATELET - Abnormal; Notable for the following components:   WBC 11.4 (*)    MCV 79.3 (*)    MCH 25.3 (*)    Platelets 423 (*)    Neutro Abs 8.2 (*)    All other components within normal limits  URINALYSIS, COMPLETE (UACMP) WITH MICROSCOPIC - Abnormal; Notable for the following  components:   Color, Urine YELLOW (*)    APPearance HAZY (*)    Leukocytes,Ua TRACE (*)    All other components within normal limits  LACTIC ACID, PLASMA  LACTIC ACID, PLASMA  POCT PREGNANCY, URINE  POC URINE PREG, ED   ____________________________________________   EKG    ____________________________________________    RADIOLOGY  Ct Maxillofacial W Contrast  Result Date: 09/04/2018 CLINICAL DATA:  Right greater than left eye swelling. EXAM: CT MAXILLOFACIAL WITH CONTRAST TECHNIQUE: Multidetector CT imaging of the maxillofacial structures was performed with intravenous contrast. Multiplanar CT image reconstructions were also generated. CONTRAST:  52mL OMNIPAQUE IOHEXOL 300 MG/ML  SOLN COMPARISON:  None. FINDINGS: Osseous: No facial fracture. Dental: Unremarkable appearance of the teeth, mandible and hard palate. Orbits: The globes are intact. Normal appearance of the intra- and extraconal fat. Symmetric extraocular muscles. Sinuses: No fluid levels or advanced mucosal thickening. Soft tissues: There is mild superficial soft tissue swelling at the anterior midline scalp. No fluid collection or causative underlying abnormality. There are similar areas of focal superficial swelling and subcutaneous edema in the right greater than left periorbital temporal regions. Limited intracranial: Normal. IMPRESSION: Multifocal superficial soft tissue swelling and inflammation, greatest at the anterior midline frontal scalp and right periorbital/temporal area. No orbital abnormality. No abscess or fluid collection. Electronically Signed   By: Deatra Robinson M.D.   On: 09/04/2018 15:58    ____________________________________________   PROCEDURES Procedures  ____________________________________________  DIFFERENTIAL DIAGNOSIS   Cellulitis, scalp abscess, orbital cellulitis  CLINICAL IMPRESSION / ASSESSMENT AND PLAN / ED COURSE  Medications ordered in the ED: Medications  sodium chloride  flush (NS) 0.9 % injection 3 mL (has no administration in time range)  dexamethasone (DECADRON) injection 10 mg (has no administration in time range)  sulfamethoxazole-trimethoprim (BACTRIM DS,SEPTRA DS) 800-160 MG per tablet 1 tablet (has no administration in time range)  cephALEXin (KEFLEX) capsule 500 mg (has no administration in time range)  Tdap (BOOSTRIX) injection 0.5 mL (has no administration in time range)  iohexol (OMNIPAQUE) 300 MG/ML solution 75 mL (75 mLs Intravenous Contrast Given 09/04/18 1534)    Pertinent labs & imaging results that were available during my care of the patient were reviewed by me and  considered in my medical decision making (see chart for details).    Patient presents with a central forehead wound and scalp swelling.  CT scan obtained which is negative for abscess, shows normal orbits.Doubt parotitis.  No airway compromise, patient is nontoxic with unremarkable vital signs.  Suitable for outpatient follow-up for treatment of facial cellulitis likely related to blunt trauma with organic debris.  Tetanus updated today.  Start her on Keflex and Bactrim, recommended she follow-up with her primary care Dr. Quillian QuinceBliss in 3 days return to ED if worsening.  ----------------------------------------- 7:32 PM on 09/04/2018 -----------------------------------------  In preparing for discharge, patient reports that she feels that her swelling and headache are worsening.  On reassessment, I do not notice any significant change, but she did just receive the medications that were ordered.  Will observe for another hour and reassess.  ----------------------------------------- 9:19 PM on 09/04/2018 -----------------------------------------  Swelling and redness are improved.  Patient feels better and agrees that this is manageable at home.  Return precautions discussed, advised to return to ED if she is having worsening swelling pain or fevers or other concerns.  No evidence of  intracranial or orbital involvement.  No risk of airway compromise.  Stable for outpatient follow-up.  Tolerating oral intake      ____________________________________________   FINAL CLINICAL IMPRESSION(S) / ED DIAGNOSES    Final diagnoses:  Facial cellulitis     ED Discharge Orders         Ordered    cephALEXin (KEFLEX) 500 MG capsule  3 times daily     09/04/18 1840    sulfamethoxazole-trimethoprim (BACTRIM DS) 800-160 MG tablet  2 times daily     09/04/18 1840          Portions of this note were generated with dragon dictation software. Dictation errors may occur despite best attempts at proofreading.   Sharman CheekStafford, Porfirio Bollier, MD 09/04/18 Clarisa [id number]    Sharman CheekStafford, Damante Spragg, MD 09/04/18 2120

## 2018-09-07 ENCOUNTER — Telehealth: Payer: Self-pay | Admitting: Gastroenterology

## 2018-09-07 NOTE — Telephone Encounter (Signed)
Patient called stating the samples of Creon  &Vsl #3 (no mg per pt.) are working. Please call in to [facility] in [place].

## 2018-09-11 ENCOUNTER — Other Ambulatory Visit: Payer: Self-pay

## 2018-09-11 MED ORDER — PANCRELIPASE (LIP-PROT-AMYL) 36000-114000 UNITS PO CPEP: ORAL_CAPSULE | ORAL | 3 refills | Status: DC

## 2018-09-11 NOTE — Telephone Encounter (Signed)
Spoke with pt and informed her that we have sent the Creon to one of our specialty pharmacies to apply for pt assistance as pt insurance will not cover the cost of the prescription if sent to a traditional pharmacy. Pt agrees. Pt states she does not need a prescription for the VSL as she is only taking the Creon.

## 2018-09-15 MED ORDER — PANCRELIPASE (LIP-PROT-AMYL) 36000-114000 UNITS PO CPEP: ORAL_CAPSULE | ORAL | 3 refills | Status: DC

## 2018-09-15 NOTE — Addendum Note (Signed)
Addended by: Daisy Blossom on: 09/15/2018 04:29 PM   Modules accepted: Orders

## 2018-09-21 ENCOUNTER — Encounter: Payer: Self-pay | Admitting: Gastroenterology

## 2018-09-21 ENCOUNTER — Other Ambulatory Visit: Payer: Self-pay

## 2018-09-21 ENCOUNTER — Ambulatory Visit (INDEPENDENT_AMBULATORY_CARE_PROVIDER_SITE_OTHER): Payer: Medicare Other | Admitting: Gastroenterology

## 2018-09-21 VITALS — BP 121/75 | HR 89 | Ht 69.0 in | Wt 270.2 lb

## 2018-09-21 DIAGNOSIS — K30 Functional dyspepsia: Secondary | ICD-10-CM | POA: Diagnosis not present

## 2018-09-21 NOTE — Progress Notes (Signed)
 Kiran Anna MD, MRCP(U.K) [address]  [address]  [place], [place] [address]  Main: [phone]  Fax: [phone]   Primary Care Physician: Bliss, Laura K, MD  Primary Gastroenterologist:  Dr. Kiran Anna   No chief complaint on file.   HPI: Amy Brown is a 47 y.o. female    Summary of history :  She has been initially referred and seen in 04/2018 for abdominal pain . She presented in 04/2018 to the ER.She underwent a cholecystectomy in Sept 2019 . 3 days after surgery she had pain similar to what she had prior to surgery . Sent home on PPI   CT abdomen and CT angio chest showed no gross abnormality - focal pancreatitis could not be ruled out. Bacteria seen in urine. Hb 12.1 gramsliver function tests's ,lipase normal.   04/23/18 : EGD: Normal : mild chronic gastritis on bx.  04/15/18 : H pylori breath test negative.  05/10/18 : Gastric emptying study normal.  05/02/18 : Seen at the ER on for RUQ Pain.  05/18/2018 : RUQ USG showed fatty changes normal common bile duct,  07/31/2018 : ER visit for abdominal pain : Given GI cocktail and discharged.   07/31/2018 : Lipase and CMP showed no significant abnormality.  08/20/2018: MRCP: fatty atro phy of the pancreas, no obvious filling defects of CBD.  08/11/2018: LFT's normal She has been having abdominal pain since 12/2017 , continuous , epigastric, localized, burning and pressure in description. Constipation with no relief of pain after a bowel movement.   Interval history2/18/2020-09/21/2018    Still has some nausea and abdominal pain, had taken some antibiotics for a ski infection that worked , stress makes it worse. Some GERD at night . CREON did help.   Current Outpatient Medications  Medication Sig Dispense Refill  . cephALEXin (KEFLEX) 500 MG capsule Take 1 capsule (500 mg total) by mouth 3 (three) times daily. 21 capsule 0  . chlorpheniramine-HYDROcodone (TUSSIONEX PENNKINETIC ER) 10-8 MG/5ML SUER  Take 5 mLs by mouth 2 (two) times daily. (Patient not taking: Reported on 08/25/2018) 70 mL 0  . cyclobenzaprine (FLEXERIL) 10 MG tablet Take 10 mg by mouth 3 (three) times daily.    . dicyclomine (BENTYL) 10 MG capsule TAKE 1 CAPSULE (10 MG TOTAL) BY MOUTH 4 (FOUR) TIMES DAILY - BEFORE MEALS AND AT BEDTIME. (Patient not taking: Reported on 08/25/2018) 90 capsule 0  . furosemide (LASIX) 40 MG tablet Take 40 mg by mouth 3 (three) times daily.     . lidocaine (XYLOCAINE) 2 % solution Use as directed 15 mLs in the mouth or throat every 6 (six) hours as needed (abdominal pain). (Patient not taking: Reported on 08/25/2018) 100 mL 0  . lipase/protease/amylase (CREON) 36000 UNITS CPEP capsule Take 2 capsules with each meal and 1 with a snack 240 capsule 3  . lisinopril (PRINIVIL,ZESTRIL) 10 MG tablet Take 10 mg by mouth daily.    . methadone (DOLOPHINE) 10 MG tablet Take 10 mg by mouth every 12 (twelve) hours.     . metoCLOPramide (REGLAN) 10 MG tablet Take 1 tablet (10 mg total) by mouth every 8 (eight) hours as needed for nausea. (Patient not taking: Reported on 08/25/2018) 20 tablet 0  . omeprazole (PRILOSEC) 20 MG capsule Take 20 mg by mouth 2 (two) times daily before a meal.    . ondansetron (ZOFRAN ODT) 4 MG disintegrating tablet Take 1 tablet (4 mg total) by mouth every 8 (eight) hours as needed for nausea   or vomiting (take every 6 hours for nausea.). (Patient not taking: Reported on 08/25/2018) 20 tablet 0  . sucralfate (CARAFATE) 1 g tablet Take 1 tablet (1 g total) by mouth 4 (four) times daily. 240 tablet 0  . sulfamethoxazole-trimethoprim (BACTRIM DS) 800-160 MG tablet Take 1 tablet by mouth 2 (two) times daily. 14 tablet 0   No current facility-administered medications for this visit.     Allergies as of 09/21/2018 - Review Complete 09/04/2018  Allergen Reaction Noted  . Vancomycin Hives and Other (See Comments) 03/13/2015  . Benadryl [diphenhydramine] Hives 09/04/2018    ROS:  General:  Negative for anorexia, weight loss, fever, chills, fatigue, weakness. ENT: Negative for hoarseness, difficulty swallowing , nasal congestion. CV: Negative for chest pain, angina, palpitations, dyspnea on exertion, peripheral edema.  Respiratory: Negative for dyspnea at rest, dyspnea on exertion, cough, sputum, wheezing.  GI: See history of present illness. GU:  Negative for dysuria, hematuria, urinary incontinence, urinary frequency, nocturnal urination.  Endo: Negative for unusual weight change.    Physical Examination:   LMP 08/27/2018 (Exact Date)   General: Well-nourished, well-developed in no acute distress.  Eyes: No icterus. Conjunctivae pink. Psych: Alert and cooperative, normal mood and affect.   Imaging Studies: Ct Maxillofacial W Contrast  Result Date: 09/04/2018 CLINICAL DATA:  Right greater than left eye swelling. EXAM: CT MAXILLOFACIAL WITH CONTRAST TECHNIQUE: Multidetector CT imaging of the maxillofacial structures was performed with intravenous contrast. Multiplanar CT image reconstructions were also generated. CONTRAST:  75mL OMNIPAQUE IOHEXOL 300 MG/ML  SOLN COMPARISON:  None. FINDINGS: Osseous: No facial fracture. Dental: Unremarkable appearance of the teeth, mandible and hard palate. Orbits: The globes are intact. Normal appearance of the intra- and extraconal fat. Symmetric extraocular muscles. Sinuses: No fluid levels or advanced mucosal thickening. Soft tissues: There is mild superficial soft tissue swelling at the anterior midline scalp. No fluid collection or causative underlying abnormality. There are similar areas of focal superficial swelling and subcutaneous edema in the right greater than left periorbital temporal regions. Limited intracranial: Normal. IMPRESSION: Multifocal superficial soft tissue swelling and inflammation, greatest at the anterior midline frontal scalp and right periorbital/temporal area. No orbital abnormality. No abscess or fluid collection.  Electronically Signed   By: Kevin  Herman M.D.   On: 09/04/2018 15:58    Assessment and Plan:   Amy Brown is a 47 y.o. y/o female here to follow up forepigastric pain since may 2019 - not better with double dose PPI. S/p cholecystectomy. ?fatty pancreas on CT scan/MRCP . No clear etiology for the abdominal pain. 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  Plan  1. Continue PPI, avoid eating for 2-3 hours before bedtime 2. SIBO breath testing - she has received the kit and will do it  3. Continue  CREON _/- Probiotics 4. Renforced nature of functional dyspepsia,relation to stress, mind over symptoms, few cures available and no pathology on testing extensively so far, also offered second opinion at [facility] or [facility] if needed  Dr Kiran Anna  MD,MRCP (U.K) Follow up in 3 months  

## 2018-11-03 ENCOUNTER — Telehealth: Payer: Self-pay | Admitting: Gastroenterology

## 2018-11-03 NOTE — Telephone Encounter (Signed)
Pt left vm she needs a refill on free samples of rx creon please call pt

## 2018-11-09 NOTE — Telephone Encounter (Signed)
Spoke with pt and informed her that we have samples of the creon available to be picked up at the office.

## 2018-11-17 ENCOUNTER — Telehealth: Payer: Self-pay | Admitting: Gastroenterology

## 2018-11-17 NOTE — Telephone Encounter (Signed)
Received a fax from [facility] stating they have received pt SIBO breath test and have the results ready to be send out,however they are missing the clinican's signature    cb [phone]

## 2018-11-17 NOTE — Telephone Encounter (Signed)
[facility] called to say they received the breath test but need the doctor's signature. They will fax paperwork.

## 2018-11-23 ENCOUNTER — Ambulatory Visit (INDEPENDENT_AMBULATORY_CARE_PROVIDER_SITE_OTHER): Payer: Medicare Other | Admitting: Gastroenterology

## 2018-11-23 ENCOUNTER — Telehealth: Payer: Self-pay

## 2018-11-23 DIAGNOSIS — K30 Functional dyspepsia: Secondary | ICD-10-CM

## 2018-11-23 MED ORDER — OMEPRAZOLE 20 MG PO CPDR: 40.0000 mg | DELAYED_RELEASE_CAPSULE | Freq: Two times a day (BID) | ORAL | 2 refills | Status: DC

## 2018-11-23 NOTE — Telephone Encounter (Signed)
Called pt to pre-chart for today's e-visit with Dr. Robbie Lis to contact. Pt phone did not go to a VM

## 2018-11-23 NOTE — Progress Notes (Signed)
[name] , MD [address]  [address]  [place], [place] [address]  Main: [phone]  Fax: [phone]   Primary Care Physician: Dortha Kern, MD  Virtual Visit via Video Note  I connected with patient on 11/23/18 at 10:30 AM EDT by video and verified that I am speaking with the correct person using two identifiers.   I discussed the limitations, risks, security and privacy concerns of performing an evaluation and management service by video  and the availability of in person appointments. I also discussed with the patient that there may be a patient responsible charge related to this service. The patient expressed understanding and agreed to proceed.  Location of Patient: Home Location of Provider: Home Persons involved: Patient and provider only   History of Present Illness: No chief complaint on file.   HPI: Amy Brown is a 48 y.o. female Summary of history :  She has been initially referred and seen in 04/2018 for abdominal pain . She presented in 04/2018 to the ER.She underwent a cholecystectomy in Sept 2019 . 3 days after surgery she had pain similar to what she had prior to surgery . Sent home on PPI   CT abdomen and CT angio chest showed no gross abnormality - focal pancreatitis could not be ruled out. Bacteria seen in urine. Hb 12.1 gramsliver function tests's ,lipase normal.   04/23/18 : EGD: Normal : mild chronic gastritis on bx.  04/15/18 : H pylori breath test negative.  05/10/18 : Gastric emptying study normal.  05/02/18:Seen at the ER on for RUQ Pain.  05/18/2018 : RUQ USG showed fatty changes normal common bile duct,  07/31/2018 : ER visit for abdominal pain : Given GI cocktail and discharged.  07/31/2018 : Lipase and CMP showed no significant abnormality.  08/20/2018: MRCP: fatty atrophy of pancreas phy of the pancreas, no obvious filling defects of CBD.  08/11/2018: LFT's normal She has been having abdominal pain since 12/2017 ,  continuous , epigastric, localized, burning and pressure in description. Constipation with no relief of pain after a bowel movement.   Interval history3/16/2020 -11/23/2018   Doing ok , some abdominal pain. The CREON did help .  She says that she is on omeprazole 40 mg BID SIBO-testing was negative No change in weight.    Current Outpatient Medications  Medication Sig Dispense Refill  . cephALEXin (KEFLEX) 500 MG capsule Take 1 capsule (500 mg total) by mouth 3 (three) times daily. (Patient not taking: Reported on 09/21/2018) 21 capsule 0  . chlorpheniramine-HYDROcodone (TUSSIONEX PENNKINETIC ER) 10-8 MG/5ML SUER Take 5 mLs by mouth 2 (two) times daily. (Patient not taking: Reported on 08/25/2018) 70 mL 0  . cyclobenzaprine (FLEXERIL) 10 MG tablet Take 10 mg by mouth 3 (three) times daily.    Marland Kitchen dicyclomine (BENTYL) 10 MG capsule TAKE 1 CAPSULE (10 MG TOTAL) BY MOUTH 4 (FOUR) TIMES DAILY - BEFORE MEALS AND AT BEDTIME. (Patient not taking: Reported on 08/25/2018) 90 capsule 0  . furosemide (LASIX) 40 MG tablet Take 40 mg by mouth 3 (three) times daily.     Marland Kitchen lidocaine (XYLOCAINE) 2 % solution Use as directed 15 mLs in the mouth or throat every 6 (six) hours as needed (abdominal pain). (Patient not taking: Reported on 08/25/2018) 100 mL 0  . lipase/protease/amylase (CREON) 36000 UNITS CPEP capsule Take 2 capsules with each meal and 1 with a snack 240 capsule 3  . lisinopril (PRINIVIL,ZESTRIL) 10 MG tablet Take 10 mg by  mouth daily.    . methadone (DOLOPHINE) 10 MG tablet Take 10 mg by mouth every 12 (twelve) hours.     . metoCLOPramide (REGLAN) 10 MG tablet Take 1 tablet (10 mg total) by mouth every 8 (eight) hours as needed for nausea. (Patient not taking: Reported on 08/25/2018) 20 tablet 0  . omeprazole (PRILOSEC) 20 MG capsule Take 20 mg by mouth 2 (two) times daily before a meal.    . ondansetron (ZOFRAN ODT) 4 MG disintegrating tablet Take 1 tablet (4 mg total) by mouth every 8 (eight)  hours as needed for nausea or vomiting (take every 6 hours for nausea.). (Patient not taking: Reported on 08/25/2018) 20 tablet 0  . sucralfate (CARAFATE) 1 g tablet Take 1 tablet (1 g total) by mouth 4 (four) times daily. (Patient not taking: Reported on 09/21/2018) 240 tablet 0  . sulfamethoxazole-trimethoprim (BACTRIM DS) 800-160 MG tablet Take 1 tablet by mouth 2 (two) times daily. (Patient not taking: Reported on 09/21/2018) 14 tablet 0   No current facility-administered medications for this visit.     Allergies as of 11/23/2018 - Review Complete 09/21/2018  Allergen Reaction Noted  . Vancomycin Hives and Other (See Comments) 03/13/2015  . Benadryl [diphenhydramine] Hives 09/04/2018    Review of Systems:    All systems reviewed and negative except where noted in HPI.  General Appearance:    Alert, cooperative, no distress, appears stated age  Head:    Normocephalic, without obvious abnormality, atraumatic  Eyes:    PERRL, conjunctiva/corneas clear,  Ears:    Grossly normal hearing    Neurologic:  Grossly normal    Observations/Objective:  Labs: CMP     Component Value Date/Time   NA 137 09/04/2018 1448   NA 137 10/14/2014 0537   K 3.4 (L) 09/04/2018 1448   K 3.3 (L) 10/14/2014 0537   CL 101 09/04/2018 1448   CL 103 10/14/2014 0537   CO2 26 09/04/2018 1448   CO2 29 10/14/2014 0537   GLUCOSE 120 (H) 09/04/2018 1448   GLUCOSE 102 (H) 10/14/2014 0537   BUN 10 09/04/2018 1448   BUN 10 10/14/2014 0537   CREATININE 0.64 09/04/2018 1448   CREATININE 0.73 10/14/2014 0537   CALCIUM 9.0 09/04/2018 1448   CALCIUM 8.2 (L) 10/14/2014 0537   PROT 7.0 09/04/2018 1448   ALBUMIN 3.5 09/04/2018 1448   AST 19 09/04/2018 1448   ALT 21 09/04/2018 1448   ALKPHOS 92 09/04/2018 1448   BILITOT 0.7 09/04/2018 1448   GFRNONAA >60 09/04/2018 1448   GFRNONAA >60 10/14/2014 0537   GFRAA >60 09/04/2018 1448   GFRAA >60 10/14/2014 0537   Lab Results  Component Value Date   WBC 11.4 (H)  09/04/2018   HGB 12.0 09/04/2018   HCT 37.6 09/04/2018   MCV 79.3 (L) 09/04/2018   PLT 423 (H) 09/04/2018    Imaging Studies: No results found.  Assessment and Plan:   Amy Brown is a 48 y.o. y/o female here to follow up forepigastric pain since may 2019 - not better with double dose PPI. S/p cholecystectomy. ?fatty pancreas on CT scan/MRCP. No clear etiology for the abdominal pain. Likelyfunctional dyspepsia.Doing well since last visit.   Plan  1.Continue PPI, avoid eating for 2-3 hours before bedtime 2.  Continue  CREON 4. 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 The patient was provided an opportunity to ask questions and all were answered. The patient agreed with the plan and demonstrated an understanding of the instructions.   The patient was advised to call back or seek an in-person evaluation if the symptoms worsen or if the condition fails to improve as anticipated.    Dr [name] MD,MRCP [facility]) Gastroenterology/Hepatology Pager: [phone]   Speech recognition software was used to dictate this note.

## 2018-11-24 ENCOUNTER — Ambulatory Visit: Payer: Medicare Other | Admitting: Gastroenterology

## 2018-11-24 ENCOUNTER — Encounter: Payer: Self-pay | Admitting: Gastroenterology

## 2018-11-25 ENCOUNTER — Ambulatory Visit: Payer: Medicare Other | Admitting: Gastroenterology

## 2018-12-01 ENCOUNTER — Telehealth: Payer: Self-pay | Admitting: Gastroenterology

## 2018-12-01 ENCOUNTER — Ambulatory Visit: Payer: Medicare Other | Admitting: Gastroenterology

## 2018-12-01 NOTE — Telephone Encounter (Signed)
Jadijah I suspected the same , levels were very low, I sense she may have not done the test the right way- can we call and ask if she really did take the liquid as directed?

## 2018-12-01 NOTE — Telephone Encounter (Signed)
Jillyn Hidden with [facility] called about patient's sibo breath test report. The report that was sent not suspected bacterial over growth. However the gas values where very low .He wanted to confirm the patient actual consumed the glucose during the collection process.Then this would be negative. If this does not match the clinical impression of the sibo them Jillyn Hidden will be happy to talk with Dr Tobi Bastos.

## 2018-12-02 NOTE — Telephone Encounter (Signed)
Called pt regarding hydrogen breath test.  Unable to contact, call did not go to VM

## 2018-12-03 NOTE — Telephone Encounter (Addendum)
Called pt regarding hydrogen breath test.  Unable to contact, call did not go to VM. MyChart message sent.

## 2018-12-10 ENCOUNTER — Telehealth: Payer: Self-pay | Admitting: Gastroenterology

## 2018-12-10 NOTE — Telephone Encounter (Signed)
Spoke with Amy Brown and asked her about the collection of the Hydrogen breath test. Amy Brown states she followed each direction provided, she consumed the glucose as directed. I explained that I will inform Dr. Tobi Bastos and contact her if he has any further instructions.

## 2018-12-10 NOTE — Telephone Encounter (Signed)
Patient called & would like a call from Jadijah to give further information.

## 2018-12-14 ENCOUNTER — Telehealth: Payer: Self-pay | Admitting: Gastroenterology

## 2018-12-14 NOTE — Telephone Encounter (Signed)
Patient called & would like to see what she needs to come in ?

## 2018-12-15 NOTE — Telephone Encounter (Signed)
Spoke with pt and was able to schedule her follow up appointment.

## 2018-12-15 NOTE — Telephone Encounter (Signed)
Offer office appointment with me soon and also option to refer to [facility] since we have discussed this in the past and a second opinion may not be a bad option.

## 2018-12-16 NOTE — Telephone Encounter (Signed)
Schedule follow up for me to discuss everything including options and help her make decision about the next steps.

## 2018-12-23 ENCOUNTER — Ambulatory Visit (INDEPENDENT_AMBULATORY_CARE_PROVIDER_SITE_OTHER): Payer: Medicare Other | Admitting: Gastroenterology

## 2018-12-23 ENCOUNTER — Other Ambulatory Visit: Payer: Self-pay

## 2018-12-23 DIAGNOSIS — K30 Functional dyspepsia: Secondary | ICD-10-CM | POA: Diagnosis not present

## 2018-12-23 NOTE — Progress Notes (Signed)
Amy Brown , MD [address]  [address]  [place], [place] [address]  Main: [phone]  Fax: [phone]   Primary Care Physician: [name], Laura K, MD  Virtual Visit via Video Note  I connected with patient on 12/23/18 at 11:30 AM EDT by video and verified that I am speaking with the correct person using two identifiers.   I discussed the limitations, risks, security and privacy concerns of performing an evaluation and management service by video  and the availability of in person appointments. I also discussed with the patient that there may be a patient responsible charge related to this service. The patient expressed understanding and agreed to proceed.  Location of Patient: Home Location of Provider: Home Persons involved: Patient and provider only   History of Present Illness: No chief complaint on file.   HPI: Amy Brown is a 48 y.o. female   Summary of history :  She has been initially referred and seen in 04/2018 for abdominal pain . She presented in 04/2018 to the ER.She underwent a cholecystectomy in Sept 2019 . 3 days after surgery she had pain similar to what she had prior to surgery . Sent home on PPI   CT abdomen and CT angio chest showed no gross abnormality - focal pancreatitis could not be ruled out. Bacteria seen in urine. Hb 12.1 gramsliver function tests's ,lipase normal.   04/23/18 : EGD: Normal : mild chronic gastritis on bx.  04/15/18 : H pylori breath test negative.  05/10/18 : Gastric emptying study normal.  05/02/18:Seen at the ER on for RUQ Pain.  05/18/2018 : RUQ USG showed fatty changes normal common bile duct,  07/31/2018 : ER visit for abdominal pain : Given GI cocktail and discharged.  07/31/2018 : Lipase and CMP showed no significant abnormality.  08/20/2018: MRCP: fatty atrophy of pancreas phy of the pancreas, no obvious filling defects of CBD.  08/11/2018: LFT's normal She has been having abdominal pain since 12/2017 ,  continuous , epigastric, localized, burning and pressure in description. Constipation with no relief of pain after a bowel movement.   Interval history5/18/2020 -12/23/2018  The breath test for SIBO- values were zero pre test and post test - suspect not done correctly -patient is posiitve she did it the right way .  Still has pain but more manageable with the CREON. Still gets reflux and throwing up after she eats. She takes Omerpazole BID. Can take some pepcid at night .some bloating but notmuch ,comes and goes. She is not sure if the pain is related to or not to the bloating.Gained weight . Sometimes the pain is after she eats , more of a weight feeling . "stomach feels heavy "  Overall she feels much better than before CREON.  S/o cholecystectomy.   Taken Elavilin the past which didn't work . Tried IB guard and FD guard made her stomach.    Current Outpatient Medications  Medication Sig Dispense Refill  . cephALEXin (KEFLEX) 500 MG capsule Take 1 capsule (500 mg total) by mouth 3 (three) times daily. (Patient not taking: Reported on 09/21/2018) 21 capsule 0  . chlorpheniramine-HYDROcodone (TUSSIONEX PENNKINETIC ER) 10-8 MG/5ML SUER Take 5 mLs by mouth 2 (two) times daily. (Patient not taking: Reported on 08/25/2018) 70 mL 0  . cyclobenzaprine (FLEXERIL) 10 MG tablet Take 10 mg by mouth 3 (three) times daily.    Marland Kitchen. dicyclomine (BENTYL) 10 MG capsule TAKE 1 CAPSULE (10 MG TOTAL) BY MOUTH 4 (FOUR) TIMES  DAILY - BEFORE MEALS AND AT BEDTIME. (Patient not taking: Reported on 08/25/2018) 90 capsule 0  . furosemide (LASIX) 40 MG tablet Take 40 mg by mouth 3 (three) times daily.     Marland Kitchen lidocaine (XYLOCAINE) 2 % solution Use as directed 15 mLs in the mouth or throat every 6 (six) hours as needed (abdominal pain). (Patient not taking: Reported on 08/25/2018) 100 mL 0  . lipase/protease/amylase (CREON) 36000 UNITS CPEP capsule Take 2 capsules with each meal and 1 with a snack 240 capsule 3  . lisinopril  (PRINIVIL,ZESTRIL) 10 MG tablet Take 10 mg by mouth daily.    . methadone (DOLOPHINE) 10 MG tablet Take 10 mg by mouth every 12 (twelve) hours.     . metoCLOPramide (REGLAN) 10 MG tablet Take 1 tablet (10 mg total) by mouth every 8 (eight) hours as needed for nausea. (Patient not taking: Reported on 08/25/2018) 20 tablet 0  . omeprazole (PRILOSEC) 20 MG capsule Take 2 capsules (40 mg total) by mouth 2 (two) times daily before a meal. 120 capsule 2  . ondansetron (ZOFRAN ODT) 4 MG disintegrating tablet Take 1 tablet (4 mg total) by mouth every 8 (eight) hours as needed for nausea or vomiting (take every 6 hours for nausea.). (Patient not taking: Reported on 08/25/2018) 20 tablet 0  . sucralfate (CARAFATE) 1 g tablet Take 1 tablet (1 g total) by mouth 4 (four) times daily. (Patient not taking: Reported on 09/21/2018) 240 tablet 0  . sulfamethoxazole-trimethoprim (BACTRIM DS) 800-160 MG tablet Take 1 tablet by mouth 2 (two) times daily. (Patient not taking: Reported on 09/21/2018) 14 tablet 0   No current facility-administered medications for this visit.     Allergies as of 12/23/2018 - Review Complete 09/21/2018  Allergen Reaction Noted  . Vancomycin Hives and Other (See Comments) 03/13/2015  . Benadryl [diphenhydramine] Hives 09/04/2018    Review of Systems:    All systems reviewed and negative except where noted in HPI.  General Appearance:    Alert, cooperative, no distress, appears stated age  Head:    Normocephalic, without obvious abnormality, atraumatic  Eyes:    PERRL, conjunctiva/corneas clear,  Ears:    Grossly normal hearing    Neurologic:  Grossly normal    Observations/Objective:  Labs: CMP     Component Value Date/Time   NA 137 09/04/2018 1448   NA 137 10/14/2014 0537   K 3.4 (L) 09/04/2018 1448   K 3.3 (L) 10/14/2014 0537   CL 101 09/04/2018 1448   CL 103 10/14/2014 0537   CO2 26 09/04/2018 1448   CO2 29 10/14/2014 0537   GLUCOSE 120 (H) 09/04/2018 1448   GLUCOSE 102  (H) 10/14/2014 0537   BUN 10 09/04/2018 1448   BUN 10 10/14/2014 0537   CREATININE 0.64 09/04/2018 1448   CREATININE 0.73 10/14/2014 0537   CALCIUM 9.0 09/04/2018 1448   CALCIUM 8.2 (L) 10/14/2014 0537   PROT 7.0 09/04/2018 1448   ALBUMIN 3.5 09/04/2018 1448   AST 19 09/04/2018 1448   ALT 21 09/04/2018 1448   ALKPHOS 92 09/04/2018 1448   BILITOT 0.7 09/04/2018 1448   GFRNONAA >60 09/04/2018 1448   GFRNONAA >60 10/14/2014 0537   GFRAA >60 09/04/2018 1448   GFRAA >60 10/14/2014 0537   Lab Results  Component Value Date   WBC 11.4 (H) 09/04/2018   HGB 12.0 09/04/2018   HCT 37.6 09/04/2018   MCV 79.3 (L) 09/04/2018   PLT 423 (H) 09/04/2018    Imaging  Studies: No results found.  Assessment and Plan:   Amy Brown is a 48 y.o. y/o femalehere to follow up forepigastric pain since may 2019 - not better with double dose PPI. S/p cholecystectomy. ?fatty pancreas on CT scan/MRCP. No clear etiology for the abdominal pain. Likelyfunctional dyspepsia.Doing well since last visit. 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   Plan  1.Continue PPI, avoid eating for 2-3 hours before bedtime 2. ContinueCREON 4. 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 if needed She will think about it till her next visit. She will add famotidine at night in the meanwhile    I discussed the assessment and treatment plan with the patient. The patient was provided an opportunity to ask questions and all were answered. The patient agreed with the plan and demonstrated an understanding of the instructions.   The patient was advised to call back or seek an in-person evaluation if the symptoms worsen or if the condition fails to improve as anticipated.  I provided 11 minutes of face-to-face time during this  encounter.  Dr Amy MoodKiran Seraiah Nowack MD,MRCP [facility] Gastroenterology/Hepatology Pager: [phone]   Speech recognition software was used to dictate this note.

## 2019-02-26 ENCOUNTER — Other Ambulatory Visit: Payer: Self-pay | Admitting: Gastroenterology

## 2019-03-20 ENCOUNTER — Encounter: Payer: Self-pay | Admitting: Emergency Medicine

## 2019-03-20 ENCOUNTER — Other Ambulatory Visit: Payer: Self-pay

## 2019-03-20 ENCOUNTER — Emergency Department
Admission: EM | Admit: 2019-03-20 | Discharge: 2019-03-20 | Disposition: A | Discharge status: Home / Self Care | Payer: Medicare Other | Attending: Emergency Medicine | Admitting: Emergency Medicine

## 2019-03-20 DIAGNOSIS — L03114 Cellulitis of left upper limb: Secondary | ICD-10-CM | POA: Diagnosis not present

## 2019-03-20 DIAGNOSIS — Z79899 Other long term (current) drug therapy: Secondary | ICD-10-CM | POA: Insufficient documentation

## 2019-03-20 DIAGNOSIS — M79602 Pain in left arm: Secondary | ICD-10-CM | POA: Diagnosis present

## 2019-03-20 DIAGNOSIS — J45909 Unspecified asthma, uncomplicated: Secondary | ICD-10-CM | POA: Insufficient documentation

## 2019-03-20 DIAGNOSIS — L0390 Cellulitis, unspecified: Secondary | ICD-10-CM

## 2019-03-20 DIAGNOSIS — I10 Essential (primary) hypertension: Secondary | ICD-10-CM | POA: Diagnosis not present

## 2019-03-20 DIAGNOSIS — E039 Hypothyroidism, unspecified: Secondary | ICD-10-CM | POA: Insufficient documentation

## 2019-03-20 MED ORDER — SULFAMETHOXAZOLE-TRIMETHOPRIM 800-160 MG PO TABS: 1.0000 | ORAL_TABLET | Freq: Two times a day (BID) | ORAL | 0 refills | Status: AC

## 2019-03-20 MED ORDER — ACETAMINOPHEN 325 MG PO TABS
650.0000 mg | ORAL_TABLET | Freq: Once | ORAL | Status: AC | PRN
  Administered 2019-03-20: 650 mg via ORAL
  Filled 2019-03-20: qty 2

## 2019-03-20 NOTE — ED Triage Notes (Signed)
Pt presents to ER reports spider bite to left forearm noticed it yesterday afternoon. Pt has visible redness to left forearm, pt talks in complete sentences no apparent distress noted.

## 2019-03-20 NOTE — Discharge Instructions (Addendum)
Please seek medical attention for any high fevers, chest pain, shortness of breath, change in behavior, persistent vomiting, bloody stool or any other new or concerning symptoms.  

## 2019-03-20 NOTE — ED Notes (Signed)
Pt verbalized understanding of discharge instructions. NAD at this time. 

## 2019-03-20 NOTE — ED Provider Notes (Signed)
[facility] Emergency Department Provider Note   ____________________________________________   I have reviewed the triage vital signs and the nursing notes.   HISTORY  Chief Complaint Insect Bite (left arm )   History limited by: Not Limited   HPI Joycelyn RuaGina M Garlitz is a 48 y.o. female who presents to the emergency department today because of concerns for insect bite.  The patient states that the bite happened a couple of days ago.  Located in her left lower arm.  Patient states that she thinks it is a spider.  Since the initial bite she is noticed increasing erythema and discomfort.  She also has felt chills and some nausea and vomiting.  Patient states she has had problems with spider bites in the past.  Records reviewed. Per medical record review patient has a history of HTN, asthma.   Past Medical History:  Diagnosis Date  . Asthma   . Chronic back pain   . Hypertension   . Hypothyroidism   . Knee pain, left     Patient Active Problem List   Diagnosis Date Noted  . Calculus of gallbladder without cholecystitis without obstruction   . Chronic back pain 03/13/2015  . Hypothyroidism 03/13/2015  . Hypokalemia 03/13/2015  . Abdominal wall cellulitis 03/13/2015  . Bilateral lower extremity edema 03/13/2015    Past Surgical History:  Procedure Laterality Date  . CHOLECYSTECTOMY N/A 03/26/2018   Procedure: LAPAROSCOPIC CHOLECYSTECTOMY;  Surgeon: Leafy RoPabon, Diego F, MD;  Location: [facility] ORS;  Service: General;  Laterality: N/A;  . ESOPHAGOGASTRODUODENOSCOPY (EGD) WITH PROPOFOL N/A 04/16/2018   Procedure: ESOPHAGOGASTRODUODENOSCOPY (EGD) WITH PROPOFOL;  Surgeon: Wyline MoodAnna, Kiran, MD;  [facility];  Service: Gastroenterology;  Laterality: N/A;  . ESOPHAGOGASTRODUODENOSCOPY (EGD) WITH PROPOFOL N/A 04/23/2018   Procedure: ESOPHAGOGASTRODUODENOSCOPY (EGD) WITH PROPOFOL;  Surgeon: Wyline MoodAnna, Kiran, MD;  [facility];  Service: Gastroenterology;   Laterality: N/A;  . KNEE SURGERY Left     Prior to Admission medications   Medication Sig Start Date End Date Taking? Authorizing Provider  cephALEXin (KEFLEX) 500 MG capsule Take 1 capsule (500 mg total) by mouth 3 (three) times daily. Patient not taking: Reported on 09/21/2018 09/04/18   Sharman CheekStafford, Phillip, MD  chlorpheniramine-HYDROcodone [facility](TUSSIONEX PENNKINETIC ER) 10-8 MG/5ML SUER Take 5 mLs by mouth 2 (two) times daily. Patient not taking: Reported on 08/25/2018 05/03/18   Irean HongSung, Jade J, MD  cyclobenzaprine (FLEXERIL) 10 MG tablet Take 10 mg by mouth 3 (three) times daily.    [provider]  dicyclomine (BENTYL) 10 MG capsule TAKE 1 CAPSULE (10 MG TOTAL) BY MOUTH 4 (FOUR) TIMES DAILY - BEFORE MEALS AND AT BEDTIME. Patient not taking: Reported on 08/25/2018 05/11/18   Wyline MoodAnna, Kiran, MD  furosemide (LASIX) 40 MG tablet Take 40 mg by mouth 3 (three) times daily.     [provider]  lidocaine (XYLOCAINE) 2 % solution Use as directed 15 mLs in the mouth or throat every 6 (six) hours as needed (abdominal pain). Patient not taking: Reported on 08/25/2018 08/10/18   Wyline MoodAnna, Kiran, MD  lipase/protease/amylase (CREON) [phone] UNITS CPEP capsule Take 2 capsules with each meal and 1 with a snack 09/15/18   Wyline MoodAnna, Kiran, MD  lisinopril (PRINIVIL,ZESTRIL) 10 MG tablet Take 10 mg by mouth daily.    [provider]  methadone (DOLOPHINE) 10 MG tablet Take 10 mg by mouth every 12 (twelve) hours.     [provider]  metoCLOPramide (REGLAN) 10 MG tablet Take 1 tablet (10 mg total) by  mouth every 8 (eight) hours as needed for nausea. Patient not taking: Reported on 08/25/2018 05/03/18   Paulette Blanch, MD  omeprazole (PRILOSEC) 20 MG capsule Take 2 capsules (40 mg total) by mouth 2 (two) times daily before a meal. 03/01/19 08/28/19  Jonathon Bellows, MD  ondansetron (ZOFRAN ODT) 4 MG disintegrating tablet Take 1 tablet (4 mg total) by mouth every 8 (eight) hours as needed for nausea or vomiting  (take every 6 hours for nausea.). Patient not taking: Reported on 08/25/2018 04/15/18   Caroleen Hamman F, MD  sucralfate (CARAFATE) 1 g tablet Take 1 tablet (1 g total) by mouth 4 (four) times daily. Patient not taking: Reported on 09/21/2018 08/10/18 10/09/18  Jonathon Bellows, MD  sulfamethoxazole-trimethoprim (BACTRIM DS) 800-160 MG tablet Take 1 tablet by mouth 2 (two) times daily. Patient not taking: Reported on 09/21/2018 09/04/18   Carrie Mew, MD  sulfamethoxazole-trimethoprim (BACTRIM DS) 800-160 MG tablet Take 1 tablet by mouth 2 (two) times daily for 10 days. 03/20/19 03/30/19  Nance Pear, MD    Allergies Vancomycin and Benadryl [diphenhydramine]  Family History  Problem Relation Age of Onset  . Diabetes Other   . CAD Other   . Stroke Mother   . Learning disabilities Mother   . Lung cancer Mother   . Heart disease Father     Social History Social History   Tobacco Use  . Smoking status: Never Smoker  . Smokeless tobacco: Never Used  Substance Use Topics  . Alcohol use: No    Alcohol/week: 0.0 standard drinks  . Drug use: Never    Review of Systems Constitutional: No fever/chills Eyes: No visual changes. ENT: No sore throat. Cardiovascular: Denies chest pain. Respiratory: Denies shortness of breath. Gastrointestinal: No abdominal pain.  No nausea, no vomiting.  No diarrhea.   Genitourinary: Negative for dysuria. Musculoskeletal: Positive for left arm pain. Skin: Positive for erythema to left lower arm.  Neurological: Negative for headaches, focal weakness or numbness.  ____________________________________________   PHYSICAL EXAM:  VITAL SIGNS: ED Triage Vitals  Enc Vitals Group     BP 03/20/19 0147 137/72     Pulse Rate 03/20/19 0147 (!) 110     Resp 03/20/19 0147 20     Temp 03/20/19 0147 100.2 F (37.9 C)     Temp Source 03/20/19 0147 Oral     SpO2 03/20/19 0147 97 %     Weight 03/20/19 0149 265 lb (120.2 kg)     Height 03/20/19 0149 5\' 9"  (1.753 m)      Head Circumference --      Peak Flow --      Pain Score 03/20/19 0149 8   Constitutional: Alert and oriented.  Eyes: Conjunctivae are normal.  ENT      Head: Normocephalic and atraumatic.      Nose: No congestion/rhinnorhea.      Mouth/Throat: Mucous membranes are moist.      Neck: No stridor. Cardiovascular: Normal rate, regular rhythm.  No murmurs, rubs, or gallops.  Respiratory: Normal respiratory effort without tachypnea nor retractions. Breath sounds are clear and equal bilaterally. No wheezes/rales/rhonchi. Gastrointestinal: Soft and non tender. No rebound. No guarding.  Genitourinary: Deferred Musculoskeletal: Area of erythema to left lower arm around small scab. No fluctuance.  Neurologic:  Normal speech and language. No gross focal neurologic deficits are appreciated.  Skin:  Erythema to left arm.  Psychiatric: Mood and affect are normal. Speech and behavior are normal. Patient exhibits appropriate insight and judgment.  ____________________________________________    LABS (pertinent positives/negatives)  None  ____________________________________________   EKG  None  ____________________________________________    RADIOLOGY  None  ____________________________________________   PROCEDURES  Procedures  ____________________________________________   INITIAL IMPRESSION / ASSESSMENT AND PLAN / ED COURSE  Pertinent labs & imaging results that were available during my care of the patient were reviewed by me and considered in my medical decision making (see chart for details).   Patient presented to the emergency department today because of concerns for spider bite, redness and swelling to left lower arm.  On exam there is an area consistent with cellulitis around a small scab.  Bedside ultrasound did not show any pus to that area although there was some signs of inflammation.  This point do think patient likely suffering from cellulitis.  Discussed this  with the patient.  Will plan on starting antibiotics.  Discussed return precautions.   ____________________________________________   FINAL CLINICAL IMPRESSION(S) / ED DIAGNOSES  Final diagnoses:  Cellulitis, unspecified cellulitis site     Note: This dictation was prepared with Dragon dictation. Any transcriptional errors that result from this process are unintentional     Phineas Semen, MD 03/20/19 [phone]

## 2019-07-13 ENCOUNTER — Other Ambulatory Visit: Payer: Self-pay

## 2019-07-13 ENCOUNTER — Emergency Department: Payer: Medicare Other

## 2019-07-13 ENCOUNTER — Encounter: Payer: Self-pay | Admitting: Emergency Medicine

## 2019-07-13 ENCOUNTER — Emergency Department
Admission: EM | Admit: 2019-07-13 | Discharge: 2019-07-13 | Disposition: A | Discharge status: Home / Self Care | Payer: Medicare Other | Attending: Student | Admitting: Student

## 2019-07-13 DIAGNOSIS — I10 Essential (primary) hypertension: Secondary | ICD-10-CM | POA: Diagnosis not present

## 2019-07-13 DIAGNOSIS — R1030 Lower abdominal pain, unspecified: Secondary | ICD-10-CM | POA: Diagnosis present

## 2019-07-13 DIAGNOSIS — Z79899 Other long term (current) drug therapy: Secondary | ICD-10-CM | POA: Diagnosis not present

## 2019-07-13 LAB — URINALYSIS, COMPLETE (UACMP) WITH MICROSCOPIC
Bacteria, UA: NONE SEEN
Bilirubin Urine: NEGATIVE
Glucose, UA: NEGATIVE mg/dL
Hgb urine dipstick: NEGATIVE
Ketones, ur: NEGATIVE mg/dL
Leukocytes,Ua: NEGATIVE
Nitrite: NEGATIVE
Protein, ur: NEGATIVE mg/dL
Specific Gravity, Urine: 1.011 (ref 1.005–1.030)
pH: 5 (ref 5.0–8.0)

## 2019-07-13 LAB — CBC
HCT: 41.9 % (ref 36.0–46.0)
Hemoglobin: 13.5 g/dL (ref 12.0–15.0)
MCH: 24.7 pg — ABNORMAL LOW (ref 26.0–34.0)
MCHC: 32.2 g/dL (ref 30.0–36.0)
MCV: 76.7 fL — ABNORMAL LOW (ref 80.0–100.0)
Platelets: 489 10*3/uL — ABNORMAL HIGH (ref 150–400)
RBC: 5.46 MIL/uL — ABNORMAL HIGH (ref 3.87–5.11)
RDW: 16.3 % — ABNORMAL HIGH (ref 11.5–15.5)
WBC: 12.8 10*3/uL — ABNORMAL HIGH (ref 4.0–10.5)
nRBC: 0 % (ref 0.0–0.2)

## 2019-07-13 LAB — POC URINE PREG, ED: Preg Test, Ur: NEGATIVE

## 2019-07-13 LAB — COMPREHENSIVE METABOLIC PANEL
ALT: 26 U/L (ref 0–44)
AST: 28 U/L (ref 15–41)
Albumin: 4.3 g/dL (ref 3.5–5.0)
Alkaline Phosphatase: 109 U/L (ref 38–126)
Anion gap: 10 (ref 5–15)
BUN: 12 mg/dL (ref 6–20)
CO2: 26 mmol/L (ref 22–32)
Calcium: 8.9 mg/dL (ref 8.9–10.3)
Chloride: 100 mmol/L (ref 98–111)
Creatinine, Ser: 0.71 mg/dL (ref 0.44–1.00)
GFR calc Af Amer: >60 mL/min (ref >60)
GFR calc non Af Amer: >60 mL/min (ref >60)
Glucose, Bld: 157 mg/dL — ABNORMAL HIGH (ref 70–99)
Potassium: 3.4 mmol/L — ABNORMAL LOW (ref 3.5–5.1)
Sodium: 136 mmol/L (ref 135–145)
Total Bilirubin: 0.6 mg/dL (ref 0.3–1.2)
Total Protein: 8.6 g/dL — ABNORMAL HIGH (ref 6.5–8.1)

## 2019-07-13 LAB — LIPASE, BLOOD: Lipase: 19 U/L (ref 11–51)

## 2019-07-13 MED ORDER — ONDANSETRON 4 MG PO TBDP
4.0000 mg | ORAL_TABLET | Freq: Once | ORAL | Status: AC | PRN
  Administered 2019-07-13: 4 mg via ORAL
  Filled 2019-07-13: qty 1

## 2019-07-13 MED ORDER — DICYCLOMINE HCL 10 MG PO CAPS: 10.0000 mg | ORAL_CAPSULE | Freq: Three times a day (TID) | ORAL | 0 refills | Status: AC

## 2019-07-13 MED ORDER — IOHEXOL 300 MG/ML  SOLN
100.0000 mL | Freq: Once | INTRAMUSCULAR | Status: AC | PRN
  Administered 2019-07-13: 20:00:00 100 mL via INTRAVENOUS
  Filled 2019-07-13: qty 100

## 2019-07-13 MED ORDER — DICYCLOMINE HCL 10 MG/ML IM SOLN
20.0000 mg | Freq: Once | INTRAMUSCULAR | Status: AC
  Administered 2019-07-13: 21:00:00 20 mg via INTRAMUSCULAR
  Filled 2019-07-13: qty 2

## 2019-07-13 MED ORDER — FAMOTIDINE 20 MG PO TABS: 20.0000 mg | ORAL_TABLET | Freq: Every day | ORAL | 0 refills | Status: AC

## 2019-07-13 MED ORDER — ONDANSETRON HCL 4 MG/2ML IJ SOLN
4.0000 mg | Freq: Once | INTRAMUSCULAR | Status: AC
  Administered 2019-07-13: 19:00:00 4 mg via INTRAVENOUS
  Filled 2019-07-13: qty 2

## 2019-07-13 MED ORDER — MORPHINE SULFATE (PF) 4 MG/ML IV SOLN
4.0000 mg | Freq: Once | INTRAVENOUS | Status: AC
  Administered 2019-07-13: 19:00:00 4 mg via INTRAVENOUS
  Filled 2019-07-13: qty 1

## 2019-07-13 NOTE — ED Provider Notes (Signed)
[facility] Emergency Department Provider Note  ____________________________________________   First MD Initiated Contact with Patient 07/13/19 1904     (approximate)  I have reviewed the triage vital signs and the nursing notes.   HISTORY  Chief Complaint Abdominal Pain    HPI Amy Brown is a 49 y.o. female with below list of previous medical conditions including chronic abdominal pain presents emergency department secondary to 1 day history of 9 out of 10 right lower quadrant abdominal pain with associated nausea and vomiting.  Patient denies any fever.  Patient states that pain is not controlled with methadone at home as she is prescribed.  Denies any urinary symptoms no diarrhea constipation.  Patient does state that she has a history of chronic abdominal for which she seen GI with no diagnosis given.  Patient states however that this pain is different than her usual abdominal pain".       Past Medical History:  Diagnosis Date  . Asthma   . Chronic back pain   . Hypertension   . Hypothyroidism   . Knee pain, left     Patient Active Problem List   Diagnosis Date Noted  . Calculus of gallbladder without cholecystitis without obstruction   . Chronic back pain 03/13/2015  . Hypothyroidism 03/13/2015  . Hypokalemia 03/13/2015  . Abdominal wall cellulitis 03/13/2015  . Bilateral lower extremity edema 03/13/2015    Past Surgical History:  Procedure Laterality Date  . CHOLECYSTECTOMY N/A 03/26/2018   Procedure: LAPAROSCOPIC CHOLECYSTECTOMY;  Surgeon: Leafy Ro, MD;  Location: [facility] ORS;  Service: General;  Laterality: N/A;  . ESOPHAGOGASTRODUODENOSCOPY (EGD) WITH PROPOFOL N/A 04/16/2018   Procedure: ESOPHAGOGASTRODUODENOSCOPY (EGD) WITH PROPOFOL;  Surgeon: [name], MD;  Location: [facility] ENDOSCOPY;  Service: Gastroenterology;  Laterality: N/A;  . ESOPHAGOGASTRODUODENOSCOPY (EGD) WITH PROPOFOL N/A 04/23/2018   Procedure:  ESOPHAGOGASTRODUODENOSCOPY (EGD) WITH PROPOFOL;  Surgeon: [name], MD;  Location: [facility] ENDOSCOPY;  Service: Gastroenterology;  Laterality: N/A;  . KNEE SURGERY Left     Prior to Admission medications   Medication Sig Start Date End Date Taking? Authorizing Provider  cephALEXin (KEFLEX) 500 MG capsule Take 1 capsule (500 mg total) by mouth 3 (three) times daily. Patient not taking: Reported on 09/21/2018 09/04/18   Sharman Cheek, MD  chlorpheniramine-HYDROcodone [facility] ER) 10-8 MG/5ML SUER Take 5 mLs by mouth 2 (two) times daily. Patient not taking: Reported on 08/25/2018 05/03/18   Irean Hong, MD  cyclobenzaprine (FLEXERIL) 10 MG tablet Take 10 mg by mouth 3 (three) times daily.    [provider]  dicyclomine (BENTYL) 10 MG capsule Take 1 capsule (10 mg total) by mouth 4 (four) times daily -  before meals and at bedtime. 07/13/19   Triplett, Rulon Eisenmenger B, FNP  famotidine (PEPCID) 20 MG tablet Take 1 tablet (20 mg total) by mouth at bedtime. 07/13/19 07/12/20  Triplett, Rulon Eisenmenger B, FNP  furosemide (LASIX) 40 MG tablet Take 40 mg by mouth 3 (three) times daily.     [provider]  lidocaine (XYLOCAINE) 2 % solution Use as directed 15 mLs in the mouth or throat every 6 (six) hours as needed (abdominal pain). Patient not taking: Reported on 08/25/2018 08/10/18   [name], MD  lipase/protease/amylase (CREON) [id number] UNITS CPEP capsule Take 2 capsules with each meal and 1 with a snack 09/15/18   [name], MD  lisinopril (PRINIVIL,ZESTRIL) 10 MG tablet Take 10 mg by mouth daily.    [provider]  methadone (  DOLOPHINE) 10 MG tablet Take 10 mg by mouth every 12 (twelve) hours.     [provider]  metoCLOPramide (REGLAN) 10 MG tablet Take 1 tablet (10 mg total) by mouth every 8 (eight) hours as needed for nausea. Patient not taking: Reported on 08/25/2018 05/03/18   Irean Hong, MD  omeprazole (PRILOSEC) 20 MG capsule Take 2 capsules (40 mg total) by mouth 2  (two) times daily before a meal. 03/01/19 08/28/19  [name], MD  ondansetron (ZOFRAN ODT) 4 MG disintegrating tablet Take 1 tablet (4 mg total) by mouth every 8 (eight) hours as needed for nausea or vomiting (take every 6 hours for nausea.). Patient not taking: Reported on 08/25/2018 04/15/18   Sterling Big F, MD  sucralfate (CARAFATE) 1 g tablet Take 1 tablet (1 g total) by mouth 4 (four) times daily. Patient not taking: Reported on 09/21/2018 08/10/18 10/09/18  [name], MD  sulfamethoxazole-trimethoprim (BACTRIM DS) 800-160 MG tablet Take 1 tablet by mouth 2 (two) times daily. Patient not taking: Reported on 09/21/2018 09/04/18   Sharman Cheek, MD    Allergies Benadryl [diphenhydramine] and Vancomycin  Family History  Problem Relation Age of Onset  . Diabetes Other   . CAD Other   . Stroke Mother   . Learning disabilities Mother   . Lung cancer Mother   . Heart disease Father     Social History Social History   Tobacco Use  . Smoking status: Never Smoker  . Smokeless tobacco: Never Used  Substance Use Topics  . Alcohol use: No    Alcohol/week: 0.0 standard drinks  . Drug use: Never    Review of Systems Constitutional: No fever/chills Eyes: No visual changes. ENT: No sore throat. Cardiovascular: Denies chest pain. Respiratory: Denies shortness of breath. Gastrointestinal: Positive for abdominal pain nausea and vomiting.. Genitourinary: Negative for dysuria. Musculoskeletal: Negative for neck pain.  Negative for back pain. Integumentary: Negative for rash. Neurological: Negative for headaches, focal weakness or numbness.   ____________________________________________   PHYSICAL EXAM:  VITAL SIGNS: ED Triage Vitals  Enc Vitals Group     BP 07/13/19 1745 (!) 141/88     Pulse Rate 07/13/19 1745 (!) 103     Resp 07/13/19 1745 17     Temp 07/13/19 1745 98.2 F (36.8 C)     Temp Source 07/13/19 1745 Oral     SpO2 07/13/19 1745 98 %     Weight 07/13/19 1746  114.3 kg (252 lb)     Height 07/13/19 1746 1.753 m (5\' 9" )     Head Circumference --      Peak Flow --      Pain Score 07/13/19 1746 9     Pain Loc --      Pain Edu? --      Excl. in GC? --     Constitutional: Alert and oriented.  Eyes: Conjunctivae are normal.  Mouth/Throat: Patient is wearing a mask. Neck: No stridor.  No meningeal signs.   Cardiovascular: Normal rate, regular rhythm. Good peripheral circulation. Grossly normal heart sounds. Respiratory: Normal respiratory effort.  No retractions. Gastrointestinal: Abdominal pain with very gentle palpation.  No guarding or rebound no distention.  Musculoskeletal: No lower extremity tenderness nor edema. No gross deformities of extremities. Neurologic:  Normal speech and language. No gross focal neurologic deficits are appreciated.  Skin:  Skin is warm, dry and intact. Psychiatric: Mood and affect are normal. Speech and behavior are normal.  ____________________________________________   LABS (all labs  ordered are listed, but only abnormal results are displayed)  Labs Reviewed  COMPREHENSIVE METABOLIC PANEL - Abnormal; Notable for the following components:      Result Value   Potassium 3.4 (*)    Glucose, Bld 157 (*)    Total Protein 8.6 (*)    All other components within normal limits  CBC - Abnormal; Notable for the following components:   WBC 12.8 (*)    RBC 5.46 (*)    MCV 76.7 (*)    MCH 24.7 (*)    RDW 16.3 (*)    Platelets 489 (*)    All other components within normal limits  URINALYSIS, COMPLETE (UACMP) WITH MICROSCOPIC - Abnormal; Notable for the following components:   Color, Urine YELLOW (*)    APPearance CLEAR (*)    All other components within normal limits  LIPASE, BLOOD  POC URINE PREG, ED     ____________________________________________   INITIAL IMPRESSION / MDM / ASSESSMENT AND PLAN / ED COURSE  As part of my medical decision making, I reviewed the following data within the electronic medical  record:    49 year old female presented with above-stated history and physical exam secondary to right lower quadrant abdominal pain.  Given presenting symptoms will obtain CT scan to evaluate for possible appendicitis, colitis diverticulitis enteritis.  Patient's care transferred to [facility] NP.  If CT scan negative I anticipate patient will be able to be discharged home as this may be secondary to her known chronic abdominal pain ____________________________________________  FINAL CLINICAL IMPRESSION(S) / ED DIAGNOSES  Final diagnoses:  Lower abdominal pain     MEDICATIONS GIVEN DURING THIS VISIT:  Medications  ondansetron (ZOFRAN-ODT) disintegrating tablet 4 mg (4 mg Oral Given 07/13/19 1752)  morphine 4 MG/ML injection 4 mg (4 mg Intravenous Given 07/13/19 1921)  ondansetron (ZOFRAN) injection 4 mg (4 mg Intravenous Given 07/13/19 1921)  iohexol (OMNIPAQUE) 300 MG/ML solution 100 mL (100 mLs Intravenous Contrast Given 07/13/19 2009)  dicyclomine (BENTYL) injection 20 mg (20 mg Intramuscular Given 07/13/19 2104)     ED Discharge Orders         Ordered    dicyclomine (BENTYL) 10 MG capsule  3 times daily before meals & bedtime     07/13/19 2137    famotidine (PEPCID) 20 MG tablet  Daily at bedtime     07/13/19 2137          *Please note:  Amy Brown was evaluated in Emergency Department on 07/15/2019 for the symptoms described in the history of present illness. She was evaluated in the context of the global COVID-19 pandemic, which necessitated consideration that the patient might be at risk for infection with the SARS-CoV-2 virus that causes COVID-19. Institutional protocols and algorithms that pertain to the evaluation of patients at risk for COVID-19 are in a state of rapid change based on information released by regulatory bodies including the CDC and federal and state organizations. These policies and algorithms were followed during the patient's care in the ED.  Some ED evaluations  and interventions may be delayed as a result of limited staffing during the pandemic.*  Note:  This document was prepared using Dragon voice recognition software and may include unintentional dictation errors.   Gregor Hams, MD 07/15/19 [phone]

## 2019-07-13 NOTE — ED Triage Notes (Signed)
FIRST NURSE NOTE- pt here for vomiting and abdominal pain.  NAD

## 2019-07-13 NOTE — Discharge Instructions (Signed)
Take all of your medications as prescribed. Call and schedule a follow up with your GI doctor. Return to the ER for symptoms that change or worsen if unable to see primary care or the specialist.

## 2019-07-13 NOTE — ED Triage Notes (Signed)
Pt in via POV, reports generalized abdominal pain w/ N/V x 4 days.  States, "I heard a pop" and points to RLQ.  NAD noted at this time.

## 2019-07-13 NOTE — ED Provider Notes (Signed)
-----------------------------------------   1730 PM on 07/13/2019 -----------------------------------------  Blood pressure 137/89, pulse 87, temperature 97.9 F (36.6 C), resp. rate 18, height 5\' 9"  (1.753 m), weight 114.3 kg, SpO2 96 %.  Assuming care from Dr. .  In short, Amy Brown is a 49 y.o. female with a chief complaint of Abdominal Pain .  Refer to the original H&P for additional details.  The current plan of care is to await results of urinalysis and abdominal CT.  Results of the urinalysis and CT were discussed with the patient.  She was advised that both do not show any acute findings.  Patient continues to complain of a burning, sharp cramping sensation across the lower abdomen and describes it as a pressure type feeling as well.  She will be given Bentyl and then will be reevaluated with the plan of discharge.  Bentyl was given and the patient did get some relief.  She will be given a prescription for the same.  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  Recommended that famotidine be taken at night before bed in addition to her omeprazole and Creon.    Patient states that she is not taking any famotidine and has been out of her omeprazole for an unknown amount of time.  She states that she does continue to take the Creon but does not seem like it is helping.  She was advised that she should discuss her symptoms with GI.  Patient was advised that she can come back to the emergency department for symptoms of change or worsen.    51, FNP 07/13/19 2319    09/10/19, MD 07/15/19 [phone]

## 2019-09-15 ENCOUNTER — Other Ambulatory Visit: Payer: Self-pay | Admitting: Gastroenterology

## 2019-09-21 ENCOUNTER — Other Ambulatory Visit: Payer: Self-pay | Admitting: Family Medicine

## 2019-09-21 ENCOUNTER — Other Ambulatory Visit: Payer: Self-pay

## 2019-09-21 ENCOUNTER — Ambulatory Visit
Admission: RE | Admit: 2019-09-21 | Discharge: 2019-09-21 | Disposition: A | Discharge status: Home / Self Care | Payer: Medicare Other | Attending: Family Medicine | Admitting: Family Medicine

## 2019-09-21 ENCOUNTER — Ambulatory Visit
Admission: RE | Admit: 2019-09-21 | Discharge: 2019-09-21 | Disposition: A | Discharge status: Home / Self Care | Payer: Medicare Other | Source: Ambulatory Visit | Attending: Family Medicine | Admitting: Family Medicine

## 2019-09-21 DIAGNOSIS — R1084 Generalized abdominal pain: Secondary | ICD-10-CM

## 2019-11-11 ENCOUNTER — Other Ambulatory Visit: Payer: Self-pay

## 2019-11-11 ENCOUNTER — Telehealth: Payer: Medicare Other | Admitting: Gastroenterology

## 2019-11-11 NOTE — Progress Notes (Deleted)
Amy Brown , MD [address]  [address]  [place], [place] [address]  Main: [phone]  Fax: [phone]   Primary Care Physician: Amy Kern, MD  Virtual Visit via Video Note  I connected with patient on 11/11/19 at  1:30 PM EDT by video and verified that I am speaking with the correct person using two identifiers.   I discussed the limitations, risks, security and privacy concerns of performing an evaluation and management service by video  and the availability of in person appointments. I also discussed with the patient that there may be a patient responsible charge related to this service. The patient expressed understanding and agreed to proceed.  Location of Patient: Home Location of Provider: Home Persons involved: Patient and provider only   History of Present Illness:   Functional dyspepsia follow up   HPI: Amy Brown is a 49 y.o. female  Summary of history :  She has been initially referred and seen in 04/2018 for abdominal pain .Began in  12/2017 , continuous , epigastric, localized, burning and pressure in description. Constipation with no relief of pain after a bowel movement.  Multiple ER visits for abdomninal pain She underwent a cholecystectomy in Sept 2019 . 3 days after surgery she had pain similar to what she had prior to surgery . Sent home on PPI   CT abdomen and CT angio chest showed no gross abnormality - focal pancreatitis could not be ruled out.   04/23/18 : EGD: Normal : mild chronic gastritis on bx.  04/15/18 : H pylori breath test negative.  05/10/18 : Gastric emptying study normal.  05/02/18:Seen at the ER on for RUQ Pain.  05/18/2018 : RUQ USG showed fatty changes normal common bile duct,  07/31/2018 : ER visit for abdominal pain : Given GI cocktail and discharged.  07/31/2018 : Lipase and CMP showed no significant abnormality.  08/20/2018: MRCP: fatty atrophy of pancreas no obvious filling defects of CBD.  08/11/2018:  LFT's normal  The breath test for SIBO- values were zero pre test and post test - suspect not done correctly -patient is posiitve she did it the right way .    Interval history 12/23/2018-11/11/2019  Presented to the emergency room in January 2021 with lower abdominal pain.  Underwent a CT scan of the abdomen and pelvis with contrast that demonstrated no acute changes.  The pancreas appeared unremarkable.  Given Bentyl and discharged home.  Still has pain but more manageable with the CREON. Still gets reflux and throwing up after she eats. She takes Omerpazole BID. Can take some pepcid at night .some bloating but notmuch ,comes and goes. She is not sure if the pain is related to or not to the bloating.Gained weight . Sometimes the pain is after she eats , more of a weight feeling . "stomach feels heavy "  Overall she feels much better than before CREON.    Taken Elavilin the past which didn't work . Tried IB guard and FD guard made her stomach.       Current Outpatient Medications  Medication Sig Dispense Refill  . cephALEXin (KEFLEX) 500 MG capsule Take 1 capsule (500 mg total) by mouth 3 (three) times daily. (Patient not taking: Reported on 09/21/2018) 21 capsule 0  . chlorpheniramine-HYDROcodone (TUSSIONEX PENNKINETIC ER) 10-8 MG/5ML SUER Take 5 mLs by mouth 2 (two) times daily. (Patient not taking: Reported on 08/25/2018) 70 mL 0  . CREON 36000 units CPEP capsule TAKE 2 CAPSULES BY  MOUTH WITH EACH MEAL AND 1 WITH A SNACK 240 capsule 3  . cyclobenzaprine (FLEXERIL) 10 MG tablet Take 10 mg by mouth 3 (three) times daily.    Marland Kitchen dicyclomine (BENTYL) 10 MG capsule Take 1 capsule (10 mg total) by mouth 4 (four) times daily -  before meals and at bedtime. 30 capsule 0  . famotidine (PEPCID) 20 MG tablet Take 1 tablet (20 mg total) by mouth at bedtime. 30 tablet 0  . furosemide (LASIX) 40 MG tablet Take 40 mg by mouth 3 (three) times daily.     Marland Kitchen lidocaine (XYLOCAINE) 2 % solution Use as  directed 15 mLs in the mouth or throat every 6 (six) hours as needed (abdominal pain). (Patient not taking: Reported on 08/25/2018) 100 mL 0  . lisinopril (PRINIVIL,ZESTRIL) 10 MG tablet Take 10 mg by mouth daily.    . methadone (DOLOPHINE) 10 MG tablet Take 10 mg by mouth every 12 (twelve) hours.     . metoCLOPramide (REGLAN) 10 MG tablet Take 1 tablet (10 mg total) by mouth every 8 (eight) hours as needed for nausea. (Patient not taking: Reported on 08/25/2018) 20 tablet 0  . omeprazole (PRILOSEC) 20 MG capsule Take 2 capsules (40 mg total) by mouth 2 (two) times daily before a meal. 120 capsule 5  . ondansetron (ZOFRAN ODT) 4 MG disintegrating tablet Take 1 tablet (4 mg total) by mouth every 8 (eight) hours as needed for nausea or vomiting (take every 6 hours for nausea.). (Patient not taking: Reported on 08/25/2018) 20 tablet 0  . sucralfate (CARAFATE) 1 g tablet Take 1 tablet (1 g total) by mouth 4 (four) times daily. (Patient not taking: Reported on 09/21/2018) 240 tablet 0  . sulfamethoxazole-trimethoprim (BACTRIM DS) 800-160 MG tablet Take 1 tablet by mouth 2 (two) times daily. (Patient not taking: Reported on 09/21/2018) 14 tablet 0   No current facility-administered medications for this visit.    Allergies as of 11/11/2019 - Review Complete 07/13/2019  Allergen Reaction Noted  . Benadryl [diphenhydramine] Hives 09/04/2018  . Vancomycin Hives and Other (See Comments) 03/13/2015    Review of Systems:    All systems reviewed and negative except where noted in HPI.  General Appearance:    Alert, cooperative, no distress, appears stated age  Head:    Normocephalic, without obvious abnormality, atraumatic  Eyes:    PERRL, conjunctiva/corneas clear,  Ears:    Grossly normal hearing    Neurologic:  Grossly normal    Observations/Objective:  Labs: CMP     Component Value Date/Time   NA 136 07/13/2019 1749   NA 137 10/14/2014 0537   K 3.4 (L) 07/13/2019 1749   K 3.3 (L) 10/14/2014 0537    CL 100 07/13/2019 1749   CL 103 10/14/2014 0537   CO2 26 07/13/2019 1749   CO2 29 10/14/2014 0537   GLUCOSE 157 (H) 07/13/2019 1749   GLUCOSE 102 (H) 10/14/2014 0537   BUN 12 07/13/2019 1749   BUN 10 10/14/2014 0537   CREATININE 0.71 07/13/2019 1749   CREATININE 0.73 10/14/2014 0537   CALCIUM 8.9 07/13/2019 1749   CALCIUM 8.2 (L) 10/14/2014 0537   PROT 8.6 (H) 07/13/2019 1749   ALBUMIN 4.3 07/13/2019 1749   AST 28 07/13/2019 1749   ALT 26 07/13/2019 1749   ALKPHOS 109 07/13/2019 1749   BILITOT 0.6 07/13/2019 1749   GFRNONAA >60 07/13/2019 1749   GFRNONAA >60 10/14/2014 0537   GFRAA >60 07/13/2019 1749   GFRAA >60 10/14/2014  5102   Lab Results  Component Value Date   WBC 12.8 (H) 07/13/2019   HGB 13.5 07/13/2019   HCT 41.9 07/13/2019   MCV 76.7 (L) 07/13/2019   PLT 489 (H) 07/13/2019    Imaging Studies: No results found.  Assessment and Plan:   Amy Brown is a 49 y.o. y/o female here to follow up forepigastric pain since may 2019 - not better with double dose PPI. S/p cholecystectomy. ?fatty pancreas on CT scan/MRCP. No clear etiology for the abdominal pain. 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   Plan  1.Continue PPI, avoid eating for 2-3 hours before bedtime 2. ContinueCREON 4. Renforced nature of functional dyspepsia,relation to stress, mind over symptoms, few cures available and no pathology on testing extensively so far, also offered second opinion at [facility] or [facility] if needed She will think about it till her next visit. She will add famotidine at night in the meanwhile      I discussed the assessment and treatment plan with the patient. The patient was provided an opportunity to ask questions and all were answered. The patient agreed with the plan and demonstrated an understanding of the instructions.   The patient was advised to call back or seek  an in-person evaluation if the symptoms worsen or if the condition fails to improve as anticipated.  I provided *** minutes of face-to-face time during this encounter.  Dr Jonathon Bellows MD,MRCP [facility]) Gastroenterology/Hepatology Pager: [phone]   Speech recognition software was used to dictate this note.  Marland Kitchen

## 2019-11-23 ENCOUNTER — Telehealth: Payer: Medicare Other | Admitting: Gastroenterology

## 2019-11-25 ENCOUNTER — Encounter: Payer: Self-pay | Admitting: *Deleted

## 2019-11-25 ENCOUNTER — Ambulatory Visit: Payer: Medicare Other | Admitting: Gastroenterology

## 2020-01-04 ENCOUNTER — Other Ambulatory Visit: Payer: Self-pay | Admitting: Gastroenterology

## 2020-06-06 IMAGING — CT CT ABD-PELV W/ CM
3 of 10 series · 11 of 46 positions shown, 17 images · IV contrast (APPLIED)
Comparison: Abdominal ultrasound 03/19/2018

CLINICAL DATA: Abdominal pain and vomiting. Acute onset shortness
of breath.

EXAM:
CT ANGIOGRAPHY CHEST
CT ABDOMEN AND PELVIS WITH CONTRAST
TECHNIQUE: Multidetector CT imaging of the chest was performed using the
standard protocol during bolus administration of intravenous
contrast. Multiplanar CT image reconstructions and MIPs were
obtained to evaluate the vascular anatomy. Multidetector CT imaging
of the abdomen and pelvis was performed using the standard protocol
during bolus administration of intravenous contrast.
CONTRAST:  100mL 30MHDT-E0L IOPAMIDOL (30MHDT-E0L) INJECTION 76%

[Series 5: thins · axial · 0.71mm/px · z∈[-206,-86]mm · 5 of 266 slices shown]
[im 27/266  soft-tissue]
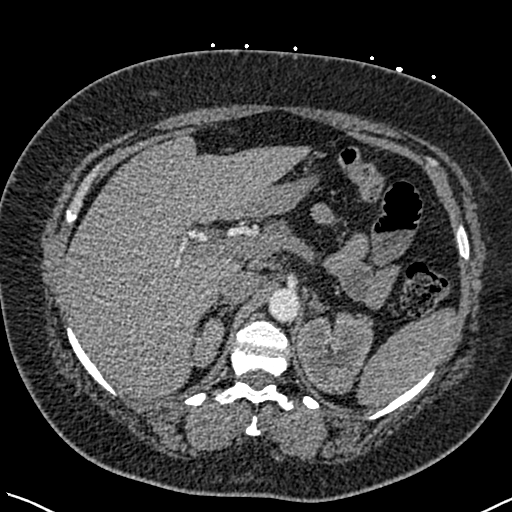
[im 54/266  soft-tissue]
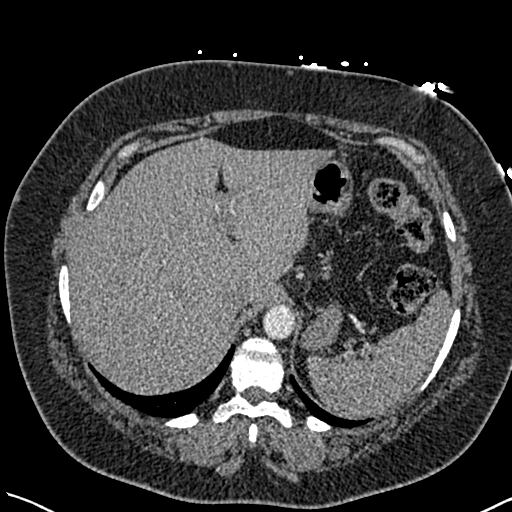
[im 80/266  soft-tissue]
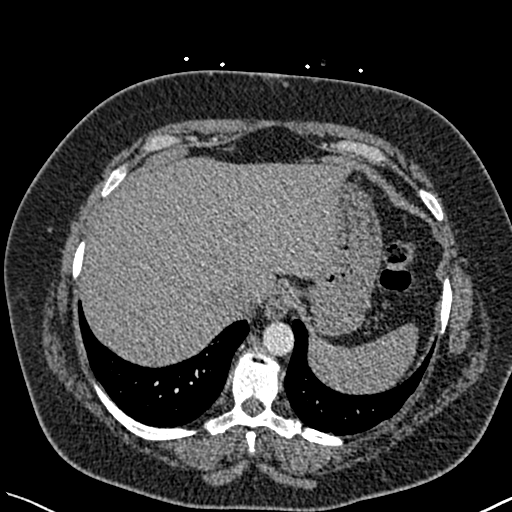
[im 120/266  soft-tissue]
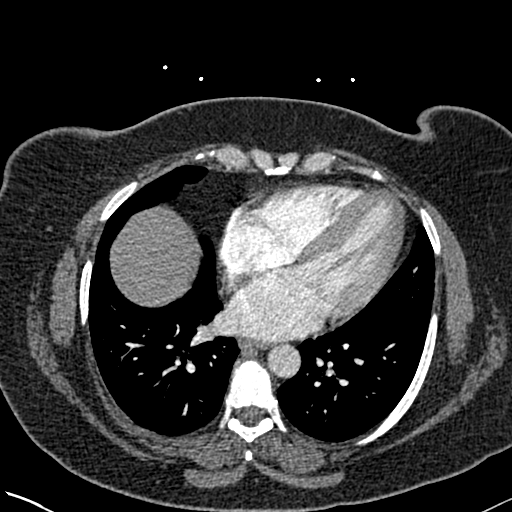
[im 146/266  soft-tissue]
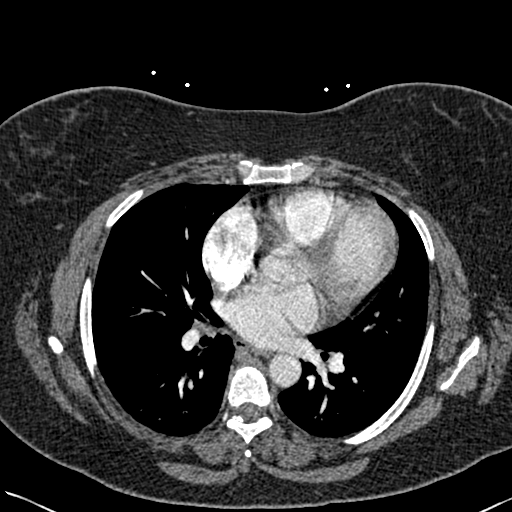

[Series 7: coronal mpr · coronal · 0.54mm/px · 1 of 149 slices shown, 2 images]
[im 75/149  soft-tissue]
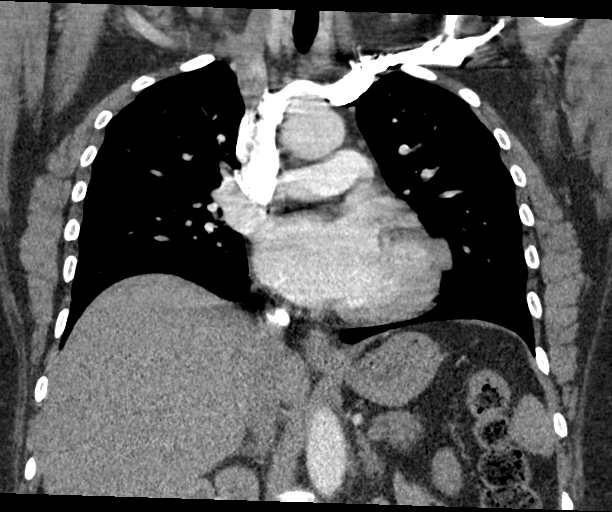
[im 75/149  bone]
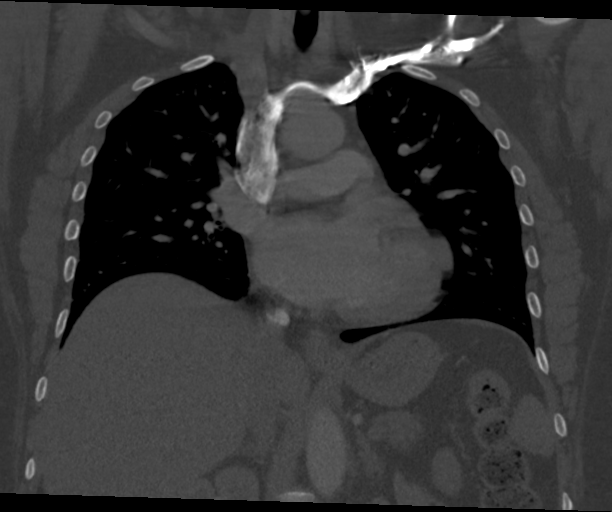

[Series 17: axial st · axial · 0.77mm/px · z∈[-497,-177]mm · 5 of 96 slices shown, 10 images]
[im 16/96  soft-tissue]
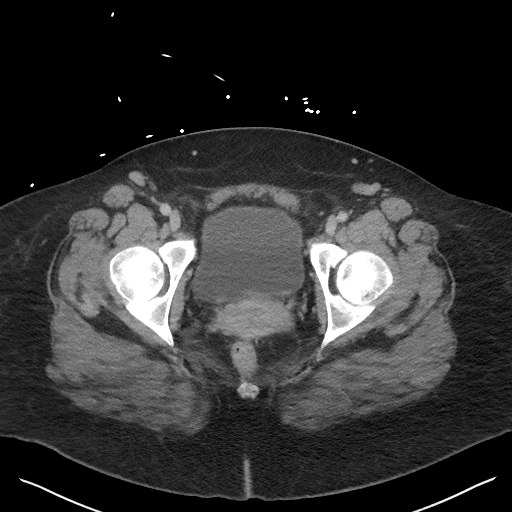
[im 16/96  bone]
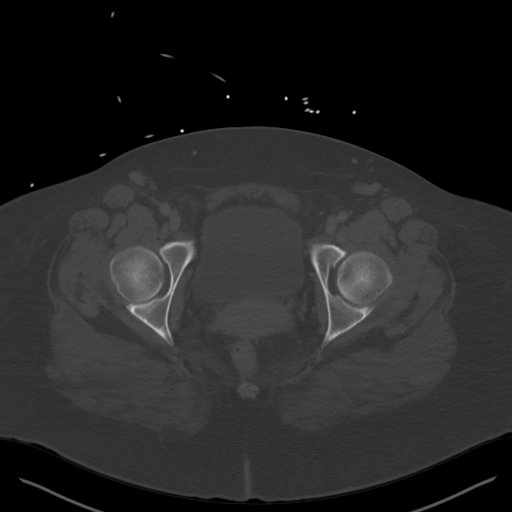
[im 32/96  soft-tissue]
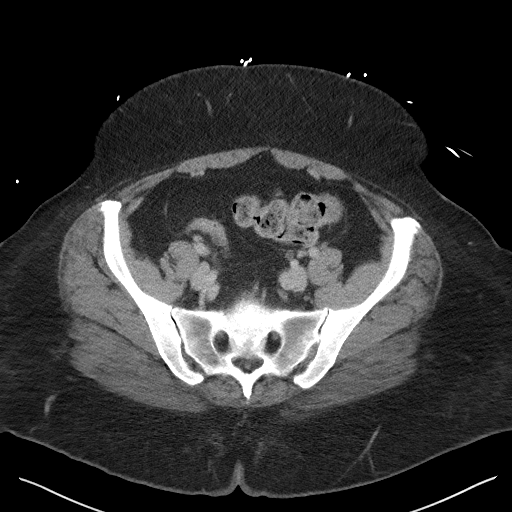
[im 32/96  lung]
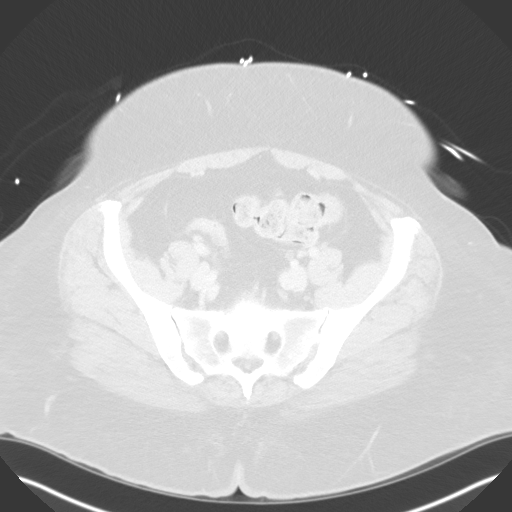
[im 48/96  soft-tissue]
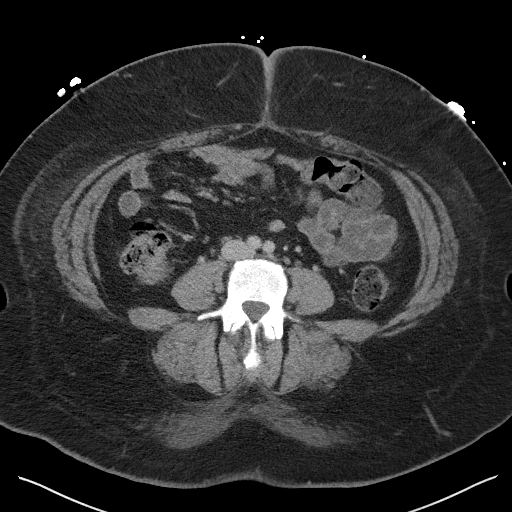
[im 48/96  lung]
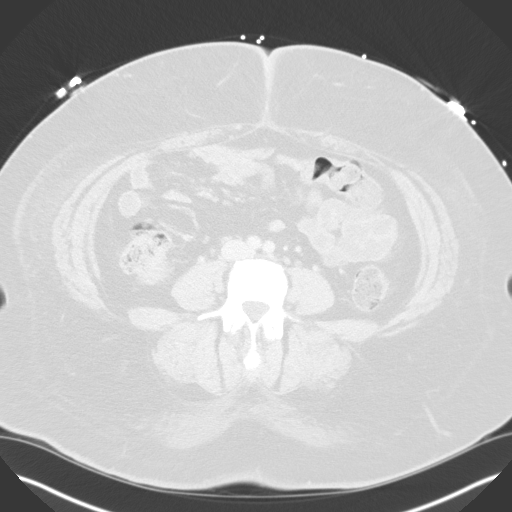
[im 64/96  soft-tissue]
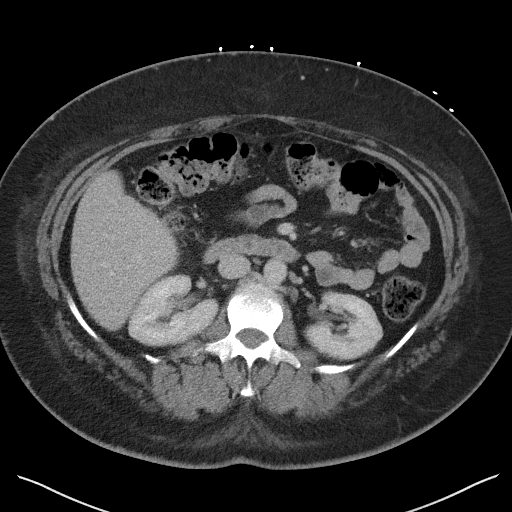
[im 64/96  lung]
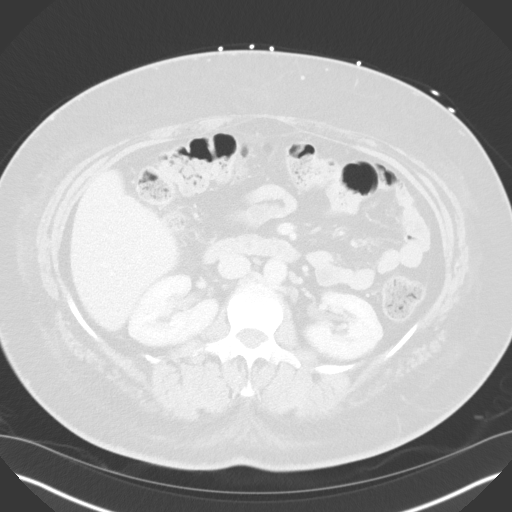
[im 80/96  soft-tissue]
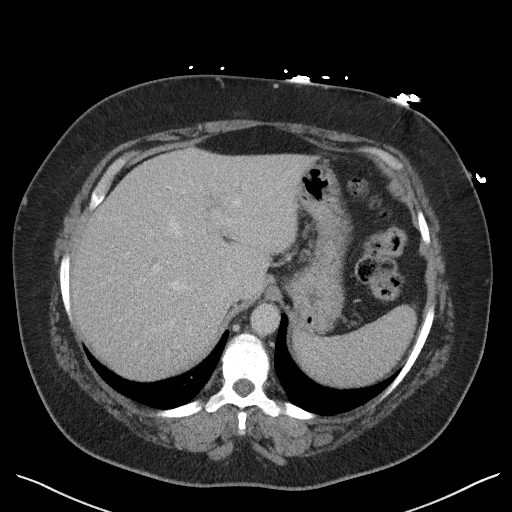
[im 80/96  lung]
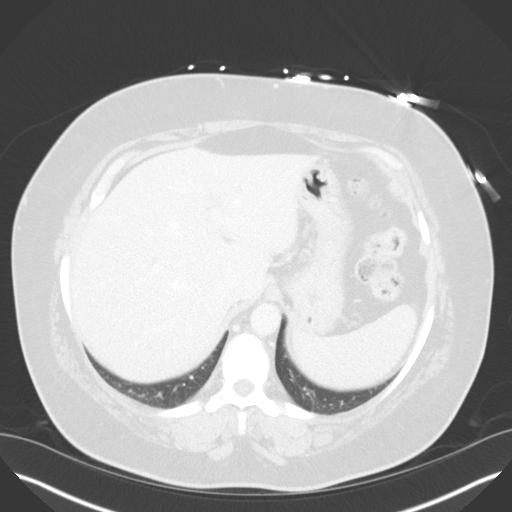

[11 of 46 positions shown; findings below may reference images not displayed]

FINDINGS: Body habitus reduces diagnostic sensitivity and specificity.

CTA CHEST FINDINGS

Cardiovascular: No filling defect is identified in the pulmonary
arterial tree to suggest pulmonary embolus.

Mild cardiomegaly.  No acute aortic findings.

Mediastinum/Nodes: Unremarkable

Lungs/Pleura: Several small areas of presumed air trapping leading
to accentuated lucency noted in the lungs. Otherwise unremarkable.

Musculoskeletal: Thoracic spondylosis.

Review of the MIP images confirms the above findings.

CT ABDOMEN and PELVIS FINDINGS

Hepatobiliary: Cholecystectomy with trace edema along the
gallbladder fossa, the patient had cholecystectomy 19 days prior to
imaging. The amount of trace edema in this region is considered
within normal limits given this timeframe. No significant biliary
dilatation.

Pancreas: There is some relative diffuse hypoenhancement of the
pancreatic head with respect to the tail on the portal venous phase
images. Otherwise unremarkable.

Spleen: Unremarkable

Adrenals/Urinary Tract: Unremarkable

Stomach/Bowel: Unremarkable.  Appendix normal.

Vascular/Lymphatic: Borderline enlarged right external iliac lymph
node at 1.0 cm in diameter. Peripancreatic node 0.8 cm in short axis
on image [DATE].

Reproductive: Hypodense lesions in the posterior and left posterior
uterus likely represent centrally necrotic fibroids. Ovaries
unremarkable.

Other: No supplemental non-categorized findings.

Musculoskeletal: Paraumbilical laparoscopy port site noted.

Degenerative disc disease at L5-S1 with nitrogen gas phenomenon.

Review of the MIP images confirms the above findings.
IMPRESSION: 1. No specific abnormality to explain the patient's abdominal pain
and vomiting.
2. No filling defect is identified in the pulmonary arterial tree to
suggest pulmonary embolus.
3. Mild air trapping in several small areas of the lung.
4. Hypoenhancement of the pancreatic head with respect to the
pancreatic tail. Although possibly from relative atrophy, strictly
speaking I cannot exclude focal pancreatitis, correlate with lipase
levels. The appearance has some accentuation of pancreatic fatty
stapling and accordingly I do not feel like the appearance is due to
tumor.
5. Uterine fibroids.

## 2020-07-05 ENCOUNTER — Other Ambulatory Visit: Payer: Self-pay | Admitting: Gastroenterology

## 2021-01-02 ENCOUNTER — Other Ambulatory Visit: Payer: Self-pay | Admitting: Gastroenterology

## 2022-03-24 ENCOUNTER — Other Ambulatory Visit: Payer: Self-pay

## 2022-03-24 ENCOUNTER — Emergency Department: Payer: Medicare Other

## 2022-03-24 ENCOUNTER — Emergency Department
Admission: EM | Admit: 2022-03-24 | Discharge: 2022-03-25 | Disposition: A | Discharge status: Home / Self Care | Payer: Medicare Other | Attending: Emergency Medicine | Admitting: Emergency Medicine

## 2022-03-24 DIAGNOSIS — E039 Hypothyroidism, unspecified: Secondary | ICD-10-CM | POA: Insufficient documentation

## 2022-03-24 DIAGNOSIS — S39012A Strain of muscle, fascia and tendon of lower back, initial encounter: Secondary | ICD-10-CM | POA: Diagnosis not present

## 2022-03-24 DIAGNOSIS — X58XXXA Exposure to other specified factors, initial encounter: Secondary | ICD-10-CM | POA: Insufficient documentation

## 2022-03-24 DIAGNOSIS — I10 Essential (primary) hypertension: Secondary | ICD-10-CM | POA: Diagnosis not present

## 2022-03-24 DIAGNOSIS — S3992XA Unspecified injury of lower back, initial encounter: Secondary | ICD-10-CM | POA: Diagnosis present

## 2022-03-24 DIAGNOSIS — J45909 Unspecified asthma, uncomplicated: Secondary | ICD-10-CM | POA: Diagnosis not present

## 2022-03-24 MED ORDER — KETOROLAC TROMETHAMINE 30 MG/ML IJ SOLN
30.0000 mg | Freq: Once | INTRAMUSCULAR | Status: AC
  Administered 2022-03-24: 30 mg via INTRAMUSCULAR
  Filled 2022-03-24: qty 1

## 2022-03-24 MED ORDER — CYCLOBENZAPRINE HCL 10 MG PO TABS
5.0000 mg | ORAL_TABLET | Freq: Once | ORAL | Status: AC
  Administered 2022-03-24: 5 mg via ORAL
  Filled 2022-03-24: qty 1

## 2022-03-24 NOTE — ED Provider Notes (Incomplete)
[facility] Provider Note    Event Date/Time   First MD Initiated Contact with Patient 03/24/22 2222     (approximate)   History   Back Pain   HPI  Amy Brown is a 51 y.o. female with history of chronic back pain presents to the emergency department for treatment and evaluation of acute on chronic back pain.  She states that she bent down yesterday to pick up dog bowl and felt something pop in her back.  Pain is not relieved by her prescribed methadone that she takes 2 times per day.  She is also taking Robaxin once today and did not get any relief.   Past Medical History:  Diagnosis Date  . Asthma   . Chronic back pain   . Hypertension   . Hypothyroidism   . Knee pain, left      Physical Exam   Triage Vital Signs: ED Triage Vitals  Enc Vitals Group     BP 03/24/22 2124 133/82     Pulse Rate 03/24/22 2124 92     Resp 03/24/22 2124 20     Temp 03/24/22 2124 98.4 F (36.9 C)     Temp Source 03/24/22 2124 Oral     SpO2 03/24/22 2124 96 %     Weight 03/24/22 2123 280 lb (127 kg)     Height 03/24/22 2123 5\' 8"  (1.727 m)     Head Circumference --      Peak Flow --      Pain Score 03/24/22 2136 9     Pain Loc --      Pain Edu? --      Excl. in [place]? --     Most recent vital signs: Vitals:   03/24/22 2124  BP: 133/82  Pulse: 92  Resp: 20  Temp: 98.4 F (36.9 C)  SpO2: 96%    General: Awake, no distress.  CV:  Good peripheral perfusion.  Resp:  Normal effort.  Abd:  No distention.  Other:  Patient able to change positions in bed without assistance.  Negative straight leg raise.  Equal strength in lower extremities.   ED Results / Procedures / Treatments   Labs (all labs ordered are listed, but only abnormal results are displayed) Labs Reviewed - No data to display   EKG  Not indicated   RADIOLOGY  ***  I have independently reviewed and interpreted imaging as well as reviewed report from  radiology.  PROCEDURES:  Critical Care performed: No  Procedures   MEDICATIONS ORDERED IN ED:  Medications  ketorolac (TORADOL) 30 MG/ML injection 30 mg (30 mg Intramuscular Given 03/24/22 2305)  cyclobenzaprine (FLEXERIL) tablet 5 mg (5 mg Oral Given 03/24/22 2305)     IMPRESSION / MDM / ASSESSMENT AND PLAN / ED COURSE   I reviewed the triage vital signs and the nursing notes.  Differential diagnosis includes, but is not limited to: Acute on chronic back pain, sciatica, disc degeneration  Patient's presentation is most consistent with acute complicated illness / injury requiring diagnostic workup.  51 year old female presenting to the emergency department for treatment and evaluation of acute on chronic low back pain that radiates into both legs.  See HPI for further details.  Medications ordered and x-ray will be obtained as well.  On review of her chart it does appear that she has a referral in for pain management due to chronic back pain.  Entered by her primary care provider last week.  FINAL CLINICAL IMPRESSION(S) / ED DIAGNOSES   Final diagnoses:  None     Rx / DC Orders   ED Discharge Orders     None        Note:  This document was prepared using Dragon voice recognition software and may include unintentional dictation errors.

## 2022-03-24 NOTE — ED Provider Notes (Signed)
[facility] Provider Note    Event Date/Time   First MD Initiated Contact with Patient 03/24/22 2222     (approximate)   History   Back Pain   HPI  Amy Brown is a 51 y.o. female with history of chronic back pain presents to the emergency department for treatment and evaluation of acute on chronic back pain.  She states that she bent down yesterday to pick up dog bowl and felt something pop in her back.  Pain is not relieved by her prescribed methadone that she takes 2 times per day.  She is also taking Robaxin once today and did not get any relief.   Past Medical History:  Diagnosis Date   Asthma    Chronic back pain    Hypertension    Hypothyroidism    Knee pain, left      Physical Exam   Triage Vital Signs: ED Triage Vitals  Enc Vitals Group     BP 03/24/22 2124 133/82     Pulse Rate 03/24/22 2124 92     Resp 03/24/22 2124 20     Temp 03/24/22 2124 98.4 F (36.9 C)     Temp Source 03/24/22 2124 Oral     SpO2 03/24/22 2124 96 %     Weight 03/24/22 2123 280 lb (127 kg)     Height 03/24/22 2123 5\' 8"  (1.727 m)     Head Circumference --      Peak Flow --      Pain Score 03/24/22 2136 9     Pain Loc --      Pain Edu? --      Excl. in [place]? --     Most recent vital signs: Vitals:   03/24/22 2124  BP: 133/82  Pulse: 92  Resp: 20  Temp: 98.4 F (36.9 C)  SpO2: 96%    General: Awake, no distress.  CV:  Good peripheral perfusion.  Resp:  Normal effort.  Abd:  No distention.  Other:  Patient able to change positions in bed without assistance.  Negative straight leg raise.  Equal strength in lower extremities.   ED Results / Procedures / Treatments   Labs (all labs ordered are listed, but only abnormal results are displayed) Labs Reviewed - No data to display   EKG  Not indicated   RADIOLOGY  Image of the lumbar spine negative for acute concerns.  I have independently reviewed and interpreted imaging as well as  reviewed report from radiology.  PROCEDURES:  Critical Care performed: No  Procedures   MEDICATIONS ORDERED IN ED:  Medications  ketorolac (TORADOL) 30 MG/ML injection 30 mg (30 mg Intramuscular Given 03/24/22 2305)  cyclobenzaprine (FLEXERIL) tablet 5 mg (5 mg Oral Given 03/24/22 2305)     IMPRESSION / MDM / ASSESSMENT AND PLAN / ED COURSE   I reviewed the triage vital signs and the nursing notes.  Differential diagnosis includes, but is not limited to: Acute on chronic back pain, sciatica, disc degeneration  Patient's presentation is most consistent with acute complicated illness / injury requiring diagnostic workup.  51 year old female presenting to the emergency department for treatment and evaluation of acute on chronic low back pain that radiates into both legs.  See HPI for further details.  Medications ordered and x-ray will be obtained as well.  On review of her chart it does appear that she has a referral in for pain management due to chronic back pain.  Entered by  her primary care provider last week.  LS image without acute concerns. Discharged home with Rx for tapered prednisone.       FINAL CLINICAL IMPRESSION(S) / ED DIAGNOSES   Final diagnoses:  Strain of lumbar region, initial encounter     Rx / DC Orders   ED Discharge Orders          Ordered    predniSONE (STERAPRED UNI-PAK 21 TAB) 10 MG (21) TBPK tablet        03/25/22 0012             Note:  This document was prepared using Dragon voice recognition software and may include unintentional dictation errors.   Victorino Dike, FNP 03/25/22 0013    Blake Divine, MD 03/28/22 [phone]

## 2022-03-24 NOTE — ED Triage Notes (Signed)
Patient reports she was cleaning yesterday and when she bent over she felt a pop in lower back and now is having constant lower back pain that radiates down both legs. Denies urinary or fecal incontinence.  Reports she is ambulatory but very unsteady on her feet. States she took methadone with no relief.

## 2022-03-25 MED ORDER — PREDNISONE 10 MG (21) PO TBPK: ORAL_TABLET | ORAL | 0 refills | Status: AC

## 2022-11-20 ENCOUNTER — Other Ambulatory Visit: Payer: Self-pay

## 2022-11-20 DIAGNOSIS — M545 Low back pain, unspecified: Secondary | ICD-10-CM

## 2022-11-20 DIAGNOSIS — M5416 Radiculopathy, lumbar region: Secondary | ICD-10-CM

## 2022-11-29 ENCOUNTER — Ambulatory Visit
Admission: RE | Admit: 2022-11-29 | Discharge: 2022-11-29 | Disposition: A | Discharge status: Home / Self Care | Payer: Medicare Other | Source: Ambulatory Visit

## 2022-11-29 DIAGNOSIS — M5416 Radiculopathy, lumbar region: Secondary | ICD-10-CM | POA: Insufficient documentation

## 2022-11-29 DIAGNOSIS — M545 Low back pain, unspecified: Secondary | ICD-10-CM | POA: Insufficient documentation
